# Patient Record
Sex: Female | Born: 1985
Health system: Southern US, Community
[De-identification: ages and names within clinical notes are randomized; demographics above are authoritative.]

## PROBLEM LIST (undated history)

## (undated) DIAGNOSIS — K589 Irritable bowel syndrome without diarrhea: Secondary | ICD-10-CM

## (undated) DIAGNOSIS — M545 Low back pain, unspecified: Secondary | ICD-10-CM

## (undated) HISTORY — DX: Low back pain: M54.5

## (undated) HISTORY — PX: TUBAL LIGATION: SHX77

## (undated) HISTORY — DX: Low back pain, unspecified: M54.50

## (undated) HISTORY — DX: Irritable bowel syndrome, unspecified: K58.9

---
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
## 2005-01-04 ENCOUNTER — Inpatient Hospital Stay (HOSPITAL_COMMUNITY): Admission: AD | Admit: 2005-01-04 | Discharge: 2005-01-04 | Payer: Self-pay | Admitting: Obstetrics and Gynecology

## 2005-01-25 ENCOUNTER — Inpatient Hospital Stay (HOSPITAL_COMMUNITY): Admission: AD | Admit: 2005-01-25 | Discharge: 2005-01-25 | Payer: Self-pay | Admitting: Obstetrics and Gynecology

## 2005-01-31 ENCOUNTER — Inpatient Hospital Stay (HOSPITAL_COMMUNITY): Admission: AD | Admit: 2005-01-31 | Discharge: 2005-02-06 | Payer: Self-pay | Admitting: Obstetrics and Gynecology

## 2005-02-02 ENCOUNTER — Encounter: Payer: Self-pay | Admitting: Pediatrics

## 2005-02-02 ENCOUNTER — Encounter (INDEPENDENT_AMBULATORY_CARE_PROVIDER_SITE_OTHER): Payer: Self-pay | Admitting: *Deleted

## 2005-02-15 ENCOUNTER — Encounter: Admission: RE | Admit: 2005-02-15 | Discharge: 2005-02-15 | Payer: Self-pay | Admitting: Pediatrics

## 2005-03-16 ENCOUNTER — Other Ambulatory Visit: Admission: RE | Admit: 2005-03-16 | Discharge: 2005-03-16 | Payer: Self-pay | Admitting: Obstetrics and Gynecology

## 2005-05-07 ENCOUNTER — Emergency Department (HOSPITAL_COMMUNITY): Admission: EM | Admit: 2005-05-07 | Discharge: 2005-05-07 | Payer: Self-pay | Admitting: Family Medicine

## 2005-06-04 ENCOUNTER — Emergency Department: Payer: Self-pay | Admitting: Emergency Medicine

## 2005-06-10 ENCOUNTER — Emergency Department: Payer: Self-pay | Admitting: Emergency Medicine

## 2005-06-18 ENCOUNTER — Emergency Department: Payer: Self-pay | Admitting: Emergency Medicine

## 2005-06-30 ENCOUNTER — Emergency Department: Payer: Self-pay | Admitting: Emergency Medicine

## 2005-07-01 ENCOUNTER — Ambulatory Visit: Payer: Self-pay | Admitting: Emergency Medicine

## 2005-07-02 ENCOUNTER — Ambulatory Visit: Payer: Self-pay | Admitting: Emergency Medicine

## 2005-10-19 ENCOUNTER — Ambulatory Visit (HOSPITAL_COMMUNITY): Admission: RE | Admit: 2005-10-19 | Discharge: 2005-10-19 | Payer: Self-pay | Admitting: Obstetrics and Gynecology

## 2005-11-30 ENCOUNTER — Inpatient Hospital Stay (HOSPITAL_COMMUNITY): Admission: AD | Admit: 2005-11-30 | Discharge: 2005-12-01 | Payer: Self-pay | Admitting: Obstetrics and Gynecology

## 2005-12-09 ENCOUNTER — Observation Stay: Payer: Self-pay

## 2005-12-14 ENCOUNTER — Emergency Department (HOSPITAL_COMMUNITY): Admission: EM | Admit: 2005-12-14 | Discharge: 2005-12-14 | Payer: Self-pay | Admitting: Family Medicine

## 2006-01-18 ENCOUNTER — Emergency Department: Payer: Self-pay | Admitting: Emergency Medicine

## 2006-01-20 ENCOUNTER — Inpatient Hospital Stay (HOSPITAL_COMMUNITY): Admission: AD | Admit: 2006-01-20 | Discharge: 2006-01-20 | Payer: Self-pay | Admitting: Obstetrics and Gynecology

## 2006-01-21 ENCOUNTER — Inpatient Hospital Stay (HOSPITAL_COMMUNITY): Admission: AD | Admit: 2006-01-21 | Discharge: 2006-01-21 | Payer: Self-pay | Admitting: Obstetrics and Gynecology

## 2006-01-24 ENCOUNTER — Inpatient Hospital Stay (HOSPITAL_COMMUNITY): Admission: AD | Admit: 2006-01-24 | Discharge: 2006-01-24 | Payer: Self-pay | Admitting: Obstetrics and Gynecology

## 2006-01-30 ENCOUNTER — Inpatient Hospital Stay (HOSPITAL_COMMUNITY): Admission: AD | Admit: 2006-01-30 | Discharge: 2006-01-30 | Payer: Self-pay | Admitting: Obstetrics and Gynecology

## 2006-02-07 ENCOUNTER — Inpatient Hospital Stay (HOSPITAL_COMMUNITY): Admission: AD | Admit: 2006-02-07 | Discharge: 2006-02-11 | Payer: Self-pay | Admitting: Obstetrics and Gynecology

## 2006-03-08 ENCOUNTER — Other Ambulatory Visit: Admission: RE | Admit: 2006-03-08 | Discharge: 2006-03-08 | Payer: Self-pay | Admitting: Obstetrics and Gynecology

## 2006-03-30 ENCOUNTER — Emergency Department: Payer: Self-pay | Admitting: Emergency Medicine

## 2006-04-08 ENCOUNTER — Emergency Department: Payer: Self-pay | Admitting: Emergency Medicine

## 2006-06-19 IMAGING — CT CT HEAD WO/W CM
1 of 3 series · 13 of 30 positions shown, 17 images · IV contrast (omnipaque)
Comparison: none

CLINICAL DATA: Status post C-section this morning.  Seizures.
HEAD CT WITHOUT AND WITH CONTRAST:
TECHNIQUE: 5mm collimated images were obtained from the base of the skull through the vertex according to standard protocol before and after administration of intravenous contrast.  Sagittal and coronal 2-D imaging was performed on CT scanner.
Contrast:  80 cc Omnipaque 300.

[Series 5: head_seq 2.5 h40s · axial · 0.43mm/px · z∈[-133,-10]mm · 13 of 59 slices shown, 17 images]
[im 5/59  brain]
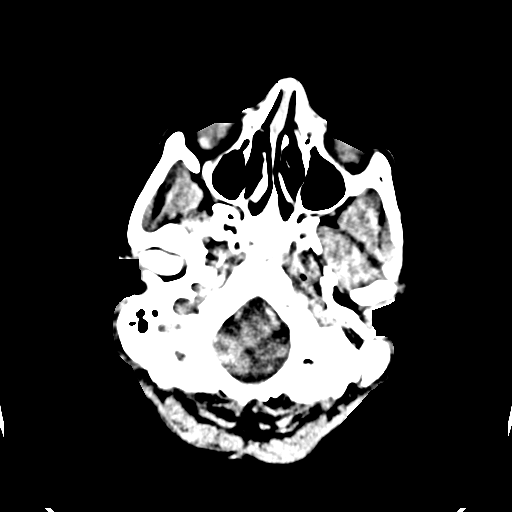
[im 5/59  bone]
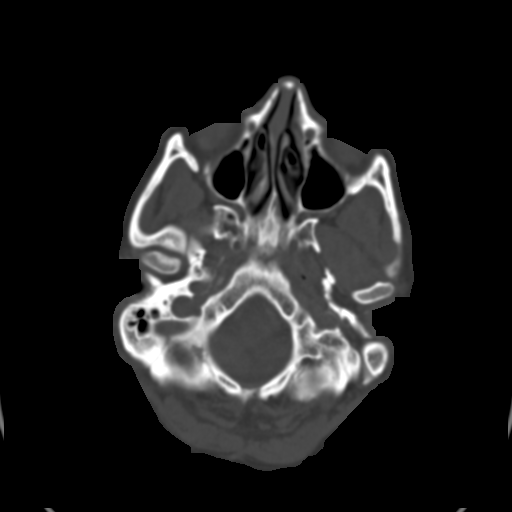
[im 9/59  brain]
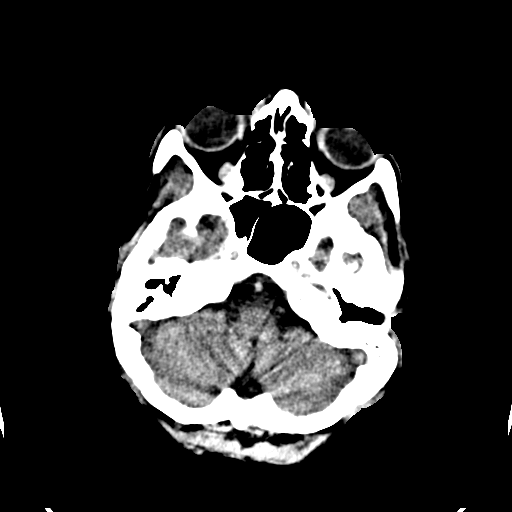
[im 13/59  brain]
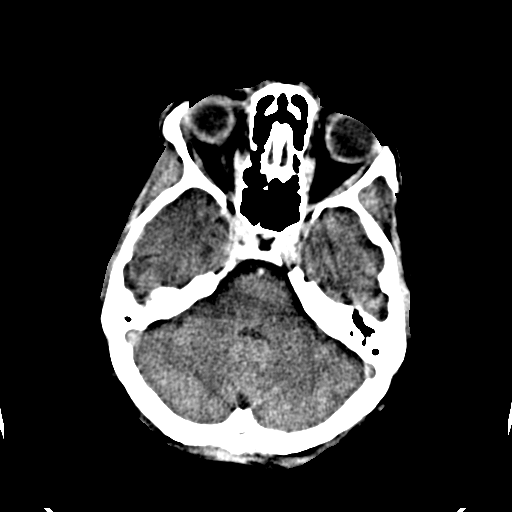
[im 17/59  brain]
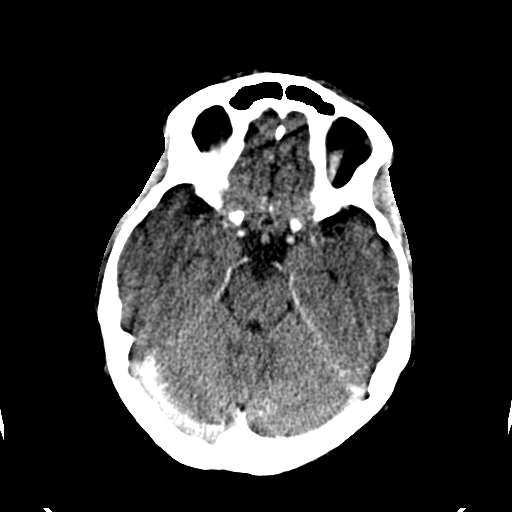
[im 21/59  brain]
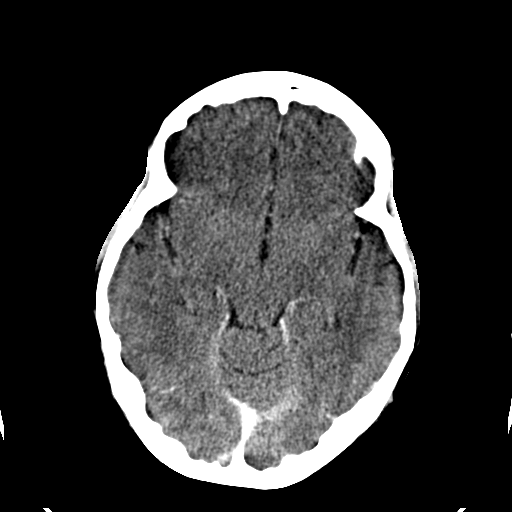
[im 21/59  bone]
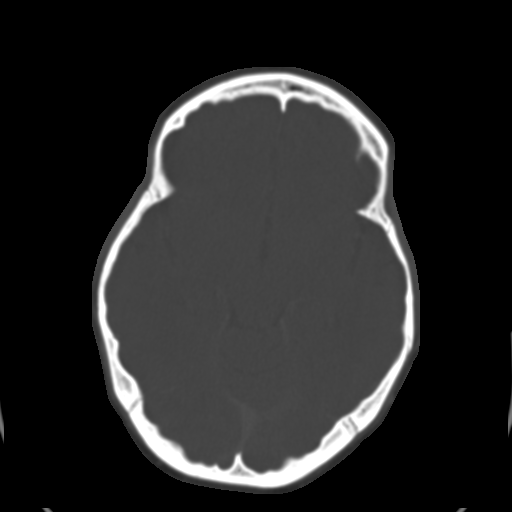
[im 25/59  brain]
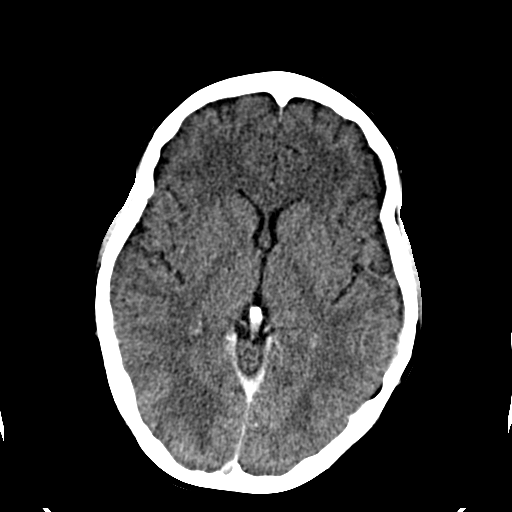
[im 30/59  brain]
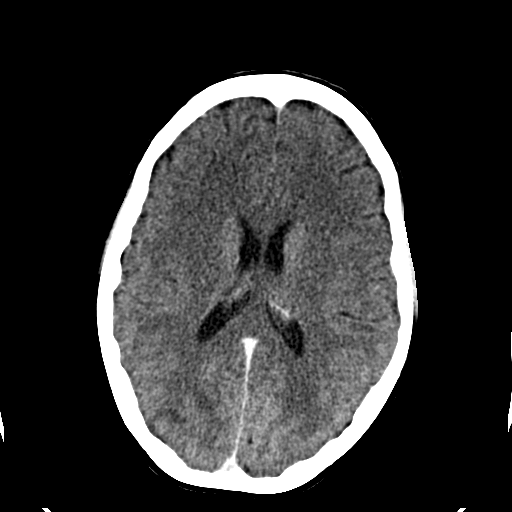
[im 34/59  brain]
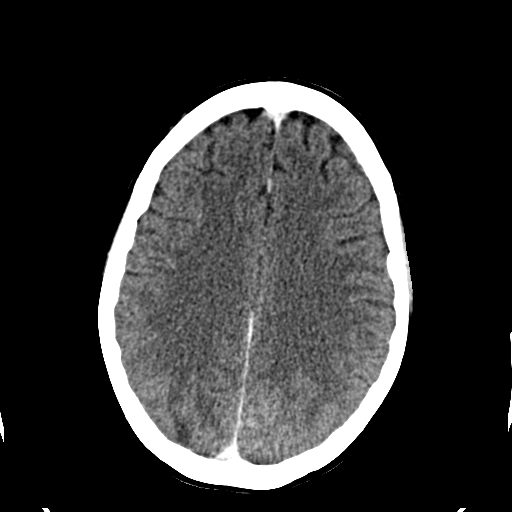
[im 38/59  brain]
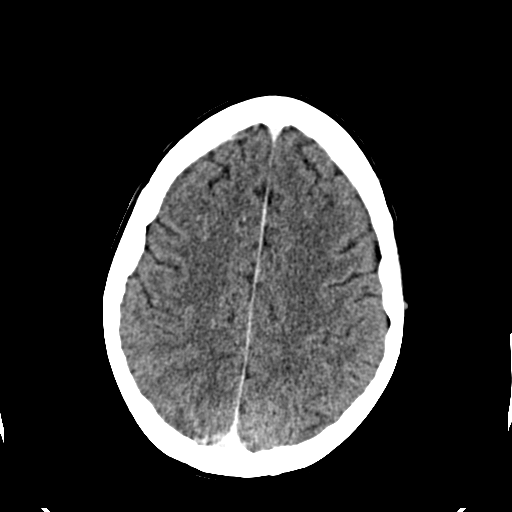
[im 38/59  bone]
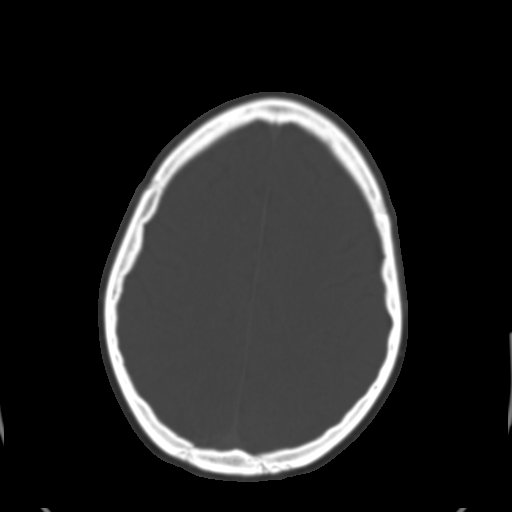
[im 42/59  brain]
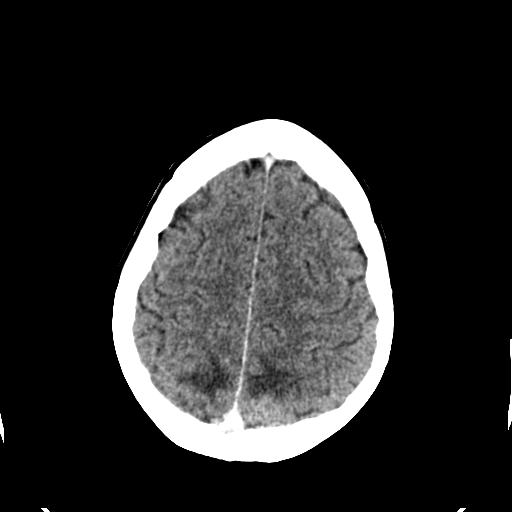
[im 46/59  brain]
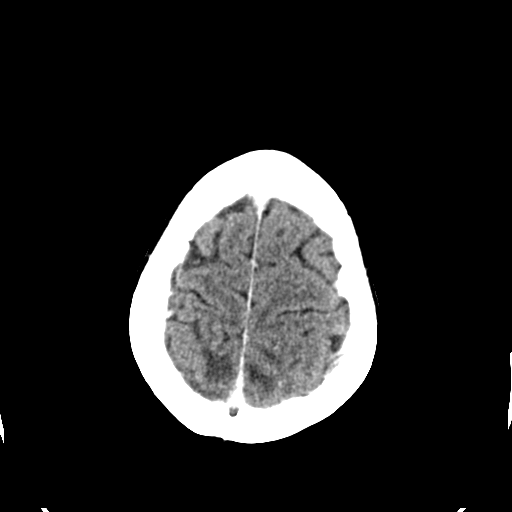
[im 50/59  brain]
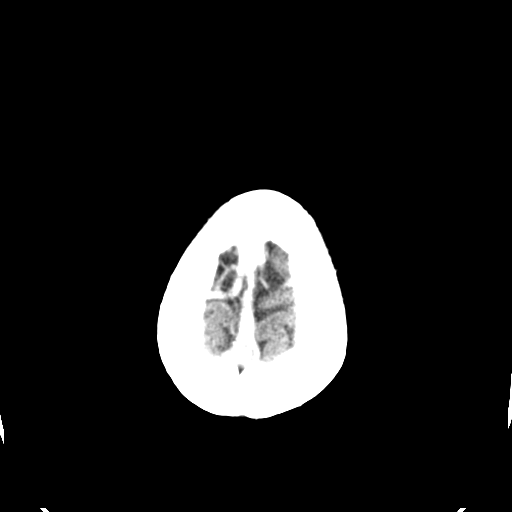
[im 54/59  brain]
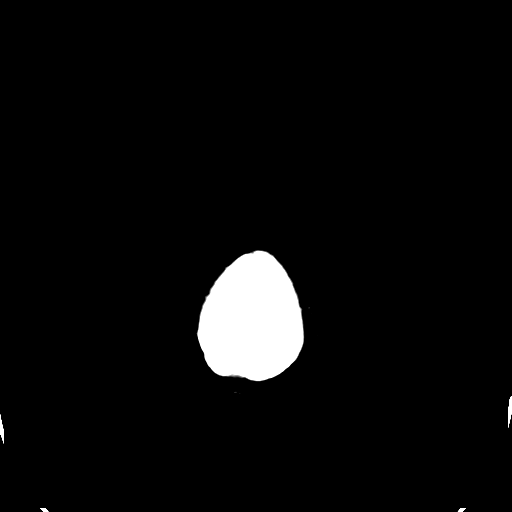
[im 54/59  bone]
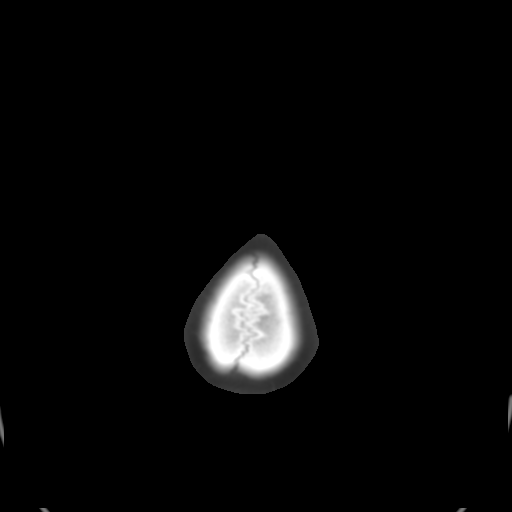

[13 of 30 positions shown; findings below may reference images not displayed]

FINDINGS: Hypodensity is noted within the occipital lobes extending towards the parietal lobes bilaterally in a distribution suggestive of posterior reversible encephalopathy syndrome.  Contrast enhanced exam was performed to exclude superior sagittal sinus thrombosis after discussion with Dr. Jim).  The superior sagittal sinus appears patent on the contrast enhanced imaging.  Transverse sinus and sigmoid sinus are difficult to conclude as completely patent on the present examination secondary to the degree of opacification in this region.  If there are any progressive symptoms, MR imaging after the patient?s skin staples have been removed is recommended.  No hydrocephalus or midline shift.  No intracranial hemorrhage (mild hyperdensity near the occipital abnormality may represent vascular engorgement rather than parenchymal or subarachnoid blood).
Partial opacification of left mastoid air cells.
IMPRESSION: Findings consistent with posterior reversible encephalopathy syndrome (PRES).

## 2006-07-29 ENCOUNTER — Emergency Department: Payer: Self-pay | Admitting: Emergency Medicine

## 2006-08-15 ENCOUNTER — Emergency Department: Payer: Self-pay | Admitting: Emergency Medicine

## 2007-01-21 ENCOUNTER — Ambulatory Visit (HOSPITAL_COMMUNITY): Admission: RE | Admit: 2007-01-21 | Discharge: 2007-01-21 | Payer: Self-pay | Admitting: Obstetrics and Gynecology

## 2007-01-21 ENCOUNTER — Encounter (INDEPENDENT_AMBULATORY_CARE_PROVIDER_SITE_OTHER): Payer: Self-pay | Admitting: Obstetrics and Gynecology

## 2007-01-25 ENCOUNTER — Emergency Department: Payer: Self-pay | Admitting: Emergency Medicine

## 2007-03-31 ENCOUNTER — Other Ambulatory Visit: Admission: RE | Admit: 2007-03-31 | Discharge: 2007-03-31 | Payer: Self-pay | Admitting: Obstetrics and Gynecology

## 2007-07-06 ENCOUNTER — Emergency Department: Payer: Self-pay | Admitting: Emergency Medicine

## 2007-10-29 ENCOUNTER — Emergency Department: Payer: Self-pay | Admitting: Emergency Medicine

## 2008-04-17 ENCOUNTER — Emergency Department: Payer: Self-pay | Admitting: Emergency Medicine

## 2008-06-21 ENCOUNTER — Emergency Department: Payer: Self-pay

## 2008-08-15 ENCOUNTER — Ambulatory Visit: Payer: Self-pay | Admitting: Gastroenterology

## 2009-02-18 ENCOUNTER — Emergency Department: Payer: Self-pay | Admitting: Unknown Physician Specialty

## 2009-08-02 LAB — CONVERTED CEMR LAB: Pap Smear: NORMAL

## 2010-06-11 ENCOUNTER — Encounter: Payer: Self-pay | Admitting: Family Medicine

## 2010-06-19 ENCOUNTER — Ambulatory Visit: Payer: Self-pay | Admitting: Family Medicine

## 2010-06-19 DIAGNOSIS — S335XXA Sprain of ligaments of lumbar spine, initial encounter: Secondary | ICD-10-CM | POA: Insufficient documentation

## 2010-06-19 DIAGNOSIS — E559 Vitamin D deficiency, unspecified: Secondary | ICD-10-CM

## 2010-06-19 DIAGNOSIS — M545 Low back pain: Secondary | ICD-10-CM

## 2010-07-10 ENCOUNTER — Telehealth: Payer: Self-pay | Admitting: Family Medicine

## 2010-08-04 ENCOUNTER — Ambulatory Visit: Payer: Self-pay | Admitting: Family Medicine

## 2010-08-04 ENCOUNTER — Other Ambulatory Visit
Admission: RE | Admit: 2010-08-04 | Discharge: 2010-08-04 | Payer: Self-pay | Source: Home / Self Care | Admitting: Family Medicine

## 2010-08-05 LAB — CONVERTED CEMR LAB: HIV: NONREACTIVE

## 2010-08-06 LAB — CONVERTED CEMR LAB: Pap Smear: NEGATIVE

## 2010-09-19 ENCOUNTER — Telehealth: Payer: Self-pay | Admitting: Family Medicine

## 2010-09-21 ENCOUNTER — Encounter: Payer: Self-pay | Admitting: Pediatrics

## 2010-10-02 NOTE — Progress Notes (Signed)
Summary: X-ray results  Phone Note Call from Patient Call back at Home Phone [phone]   Caller: Patient Call For: Ruthe Mannan MD Summary of Call: Patient wanted to know the results of her lumbar x-ray that should be in her medical records from [facility]. Initial call taken by: Mervin Hack CMA Duncan Dull),  July 10, 2010 8:43 AM  Follow-up for Phone Call        It was normal. Ruthe Mannan MD  July 10, 2010 8:46 AM  Patient advised as instructed via telephone.  Follow-up by: Linde Gillis CMA Duncan Dull),  July 10, 2010 8:54 AM

## 2010-10-02 NOTE — Letter (Signed)
Summary: Records from [facility] 2009 - 2011  Records from [facility] 2009 - 2011   Imported By: Maryln Gottron 07/03/2010 13:57:45  _____________________________________________________________________  External Attachment:    Type:   Image     Comment:   External Document

## 2010-10-02 NOTE — Assessment & Plan Note (Signed)
Summary: cpx/pap/dlo   Vital Signs:  Patient profile:   25 year old female Height:      60 inches Weight:      119.50 pounds BMI:     23.42 Temp:     98.2 degrees F oral Pulse rate:   72 / minute Pulse rhythm:   regular BP sitting:   100 / 60  (right arm) Cuff size:   regular  Vitals Entered By: Linde Gillis CMA Duncan Dull) (August 04, 2010 8:32 AM) CC: complete physical with pap   History of Present Illness: 25 yo here for CPX with no complaints.     Well woman- G2P2, one abnormal pap smear less than 5 years ago.  Last December, pap was normal. Sexually active with husband only. Had fasting labs at previous PMD on 06/11/10.  LDL was 98, HDL 53, TG 80.  BMET and CBC wnl. Vit D was 25, has not yet started supplementation.   Wants to be tested for STDs.  Married and sexually active with husband only but want to be sure.  Allergies (verified): No Known Drug Allergies  Review of Systems      See HPI General:  Denies malaise. Eyes:  Denies blurring. GU:  Denies dysuria, incontinence, and urinary frequency.  Physical Exam  General:  Well-developed,well-nourished,in no acute distress; alert,appropriate and cooperative throughout examination Head:  normocephalic and atraumatic.   Eyes:  vision grossly intact, pupils equal, and pupils round.   Ears:  R ear normal and L ear normal.   Nose:  no external deformity.   Mouth:  good dentition.   Neck:  No deformities, masses, or tenderness noted. Breasts:  No mass, nodules, thickening, tenderness, bulging, retraction, inflamation, nipple discharge or skin changes noted.   Lungs:  Normal respiratory effort, chest expands symmetrically. Lungs are clear to auscultation, no crackles or wheezes. Heart:  Normal rate and regular rhythm. S1 and S2 normal without gallop, murmur, click, rub or other extra sounds. Abdomen:  Bowel sounds positive,abdomen soft and non-tender without masses, organomegaly or hernias noted. Rectal:  no external  abnormalities.   Genitalia:  Pelvic Exam:        External: normal female genitalia without lesions or masses        Vagina: normal without lesions or masses        Cervix: normal without lesions or masses        Adnexa: normal bimanual exam without masses or fullness        Uterus: normal by palpation        Pap smear: performed Msk:  No deformity or scoliosis noted of thoracic or lumbar spine.   Extremities:  no edema Neurologic:  alert & oriented X3 and gait normal.   Skin:  Intact without suspicious lesions or rashes Cervical Nodes:  No lymphadenopathy noted Axillary Nodes:  No palpable lymphadenopathy Inguinal Nodes:  No significant adenopathy Psych:  Cognition and judgment appear intact. Alert and cooperative with normal attention span and concentration. No apparent delusions, illusions, hallucinations   Impression & Recommendations:  Problem # 1:  Preventive Health Care (ICD-V70.0) Reviewed preventive care protocols, scheduled due services, and updated immunizations Discussed nutrition, exercise, diet, and healthy lifestyle.  Pap today. Labs UTD.  Problem # 2:  VITAMIN D DEFICIENCY (ICD-268.9) Assessment: New Vit D3 50,000 units x 6 weeks - 1 tab po weekly x 6 weeks,, then continue VitD 1600 IU daily.  Recheck in 8-12 weeks (268.0)  Problem # 3:  SCREENING EXAMINATION FOR  VENEREAL DISEASE (ICD-V74.5)  Orders: T-HIV Antibody  (Reflex) [phone]) T-RPR (Syphilis) ([id number]) Venipuncture (52841)  Complete Medication List: 1)  Vitamin D3 50000 Unit Caps (Cholecalciferol) .Marland Kitchen.. 1 tab po weekly x 6 weeks  Patient Instructions: 1)  Vit D3 50,000 units x 6 weeks - 1 tab po weekly x 6 weeks , then continue VitD 1600 IU daily.  Recheck in 8-12 weeks (268.0) 2)  Have a wonderful Christmas. Prescriptions: VITAMIN D3 50000 UNIT CAPS (CHOLECALCIFEROL) 1 tab po weekly x 6 weeks  #6 x 0   Entered and Authorized by:   Ruthe Mannan MD   Signed by:   Ruthe Mannan MD on 08/04/2010    Method used:   Electronically to        [facility] (retail)       [address], [address]       [place], [place]  [address]       Ph: [phone]       Fax: [phone]   RxID:   [id number]    Orders Added: 1)  T-HIV Antibody  (Reflex) [[id number]] 2)  T-RPR (Syphilis) [phone] 3)  Venipuncture [22025] 4)  Est. Patient 18-39 years [99395]    Current Allergies (reviewed today): No known allergies   HDL Result Date:  06/11/2010 HDL Result:  53 LDL Result Date:  06/11/2010 LDL Result:  98   Appended Document: cpx/pap/dlo

## 2010-10-02 NOTE — Progress Notes (Signed)
Summary: broken toe   Phone Note Call from Patient Call back at [phone]   Caller: Patient Call For: Ruthe Mannan MD Summary of Call: Patient says that she had broken her toe in the past and she thinks that she has broken the same toe again. She saw a dr. at [facility] for this the first time, but really doesn't want to go back to him. She is asking if you could referr her to some one else. Please advise.  Initial call taken by: Melody Comas,  September 19, 2010 8:36 AM  Follow-up for Phone Call        I can place order for ortho referral and see where else Shirlee Limerick can send her but she would need to provide [facility] with more information about her toe, like which toe it is, etc. Ruthe Mannan MD  September 19, 2010 9:57 AM   Additional Follow-up for Phone Call Additional follow up Details #1::        Called the patient and gave her the number for [facility]. She wanted to call herself. Additional Follow-up by: Carlton Adam,  September 19, 2010 11:48 AM  New Problems: ? of FRACTURE, TOE (ICD-826.0)   New Problems: ? of FRACTURE, TOE (ICD-826.0)

## 2010-10-02 NOTE — Assessment & Plan Note (Signed)
Summary: NEW PT TO EST/CLE   Vital Signs:  Patient profile:   25 year old female Height:      60 inches Weight:      117 pounds BMI:     22.93 Temp:     98.3 degrees F oral Pulse rate:   80 / minute Pulse rhythm:   regular BP sitting:   100 / 60  (left arm) Cuff size:   regular  Vitals Entered By: Linde Gillis CMA Duncan Dull) (June 19, 2010 9:55 AM) CC: new patient, establish care   CC:  new patient and establish care.  History of Present Illness: 25 yo here to establish care.  Low back pain- ongoing issue for several months.  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  Pain is bilateral, lumbar.  No radiculopathy or weakness.  Pain comes and goes, worsened by certain movements like picking something up. Xray done last week at previous MD office, per pt showed small herniated disc and given Flexeril which is not helping.  ?Vitamin D deficiency- was told last week Vit D is very low, not on vit d replacement (awaiting labs).  Well woman- G2P2, one abnormal pap smear less than 5 years ago.  Last December, pap was normal. Sexually active with husband only. Had fasting labs last week.   Preventive Screening-Counseling & Management  Alcohol-Tobacco     Smoking Status: never      Drug Use:  no.    Current Medications (verified): 1)  Skelaxin 800 Mg Tabs (Metaxalone) .Marland Kitchen.. 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 Eyes:  Denies blurring. ENT:  Denies difficulty swallowing. CV:  Denies chest pain or discomfort. Resp:  Denies shortness of breath. GI:  Denies abdominal pain and change in bowel habits. GU:  Denies discharge and dysuria. MS:  Complains of low back pain; denies loss of strength. Derm:  Denies rash. Neuro:  Denies visual disturbances and weakness. Psych:  Denies anxiety and depression. Endo:  Denies cold intolerance and heat intolerance.  Physical Exam  General:  Well-developed,well-nourished,in no acute distress; alert,appropriate and cooperative throughout examination Head:  normocephalic and atraumatic.   Eyes:  vision grossly intact, pupils equal, and pupils round.   Ears:  R ear normal and L ear normal.   Nose:  no external deformity.   Mouth:  good dentition.   Neck:  No deformities, masses, or tenderness noted. Lungs:  Normal respiratory effort, chest expands symmetrically. Lungs are clear to auscultation, no crackles or wheezes. Heart:  Normal rate and regular rhythm. S1 and S2 normal without gallop, murmur, click, rub or other extra sounds. Abdomen:  Bowel sounds positive,abdomen soft and non-tender without masses, organomegaly or hernias noted. Msk:  mod tight lumbar paraspinous muslces, bilaterally, NTTP. normal strength. SLR, Faber neg bilaterally Extremities:  no edema. Neurologic:  alert &  oriented X3, strength normal in all extremities, and DTRs symmetrical and normal.   Skin:  Intact without suspicious lesions or rashes Psych:  Cognition and judgment appear intact. Alert and cooperative with normal attention span and concentration. No apparent delusions, illusions, hallucinations   Impression & Recommendations:  Problem # 1:  BACK STRAIN, LUMBAR (ICD-847.2) Assessment New Awaiting xray from previous MD. reassuring finding on exam, no  radiulopathy. Reflexes symmetrical and normal. Will try Skelaxin, continue exercises as discussed.  Problem # 2:  VITAMIN D DEFICIENCY (ICD-268.9) Assessment: New Awaiting labs, if low will need high dose replacement.  Pt in agreement with plan.  Complete Medication List: 1)  Skelaxin 800 Mg Tabs (Metaxalone) .Marland Kitchen.. 1 tab by mouth three times a day as needed back pain  Patient Instructions: 1)  Great to meet you. 2)  We will call you when we get your records from your previous doctor.  Call us next week if you have not heard from [place] yet. 3)  Make an appointment for a complete physical/Pap after 08/02/2010. Prescriptions: SKELAXIN 800 MG TABS (METAXALONE) 1 tab by mouth three times a day as needed back pain  #30 x 0   Entered and Authorized by:   Ruthe Mannan MD   Signed by:   Ruthe Mannan MD on 06/19/2010   Method used:   Print then Give to Patient   RxID:   [id number]    Orders Added: 1)  New Patient Level III [99203]    Prior Medications (reviewed today): None Current Allergies (reviewed today): No known allergies  Flu Vaccine Result Date:  06/12/2010 Flu Vaccine Result:  historical TD Result Date:  06/09/2006 TD Result:  historical PAP Result Date:  08/02/2009 PAP Result:  normal-historical

## 2010-12-15 ENCOUNTER — Encounter: Payer: Self-pay | Admitting: *Deleted

## 2010-12-15 ENCOUNTER — Encounter: Payer: Self-pay | Admitting: Family Medicine

## 2010-12-15 ENCOUNTER — Ambulatory Visit (INDEPENDENT_AMBULATORY_CARE_PROVIDER_SITE_OTHER): Admitting: Family Medicine

## 2010-12-15 VITALS — BP 110/70 | Temp 101.2°F | Ht 60.0 in | Wt 125.1 lb

## 2010-12-15 DIAGNOSIS — J029 Acute pharyngitis, unspecified: Secondary | ICD-10-CM

## 2010-12-15 MED ORDER — AMOXICILLIN 500 MG PO CAPS: 1000.0000 mg | ORAL_CAPSULE | Freq: Three times a day (TID) | ORAL | Status: AC

## 2010-12-15 NOTE — Patient Instructions (Signed)
Strep Throat, Adult Strep throat is an infection of the throat caused by a germ (bacteria). A bacteria is a type of tiny living thing that may cause disease. It is most common in late winter and early spring but can happen any time of the year. For patients who have not had their tonsils removed before, this germ can cause Strep tonsillitis. If someone has had the tonsils removed, they can still get Strep throat. Strep throat is contagious and can spread through coughing or sneezing and other close contact with someone who has this problem. SYMPTOMS Common symptoms may include:  Fever.   Painful, red tonsils and/or throat.   White or yellow spots on tonsils and/or throat.   Swollen, tender lymph nodes or "glands" of the neck and/or under the jaw.   Red rash all over the body (uncommon).  DIAGNOSIS In most cases, a "rapid strep test" can help your caregiver make the diagnosis in a few minutes. If this test is not available, a light swab of infected area can be used for a throat culture to see if Strep bacteria are present. The results of a throat culture take about 2 days. TREATMENT Strep throat is generally treated with antibiotic medicine. HOME CARE INSTRUCTIONS  Gargle with 1 teaspoon of salt in 1 cup of warm water, 3 to 4 times per day or as necessary for comfort.   Family members with a sore throat or fever should have a medical examination or throat culture. If there has been a positive throat culture in the family, your caregiver may treat the rest of the family without seeing them. This depends upon his knowledge of their condition and his familiarity with you and your family.   You may return to work when you feel able.   Only take over-the-counter or prescription medicines for pain, discomfort or fever as directed by your caregiver.  USE IBUPROFEN 800 MG THREE TIMES A DAY FOR THROAT PAIN AND FEVER. SEEK MEDICAL CARE IF:  You have an oral temperature above 100 on antibiotics.     You develop large glands in your neck.   You develop a rash, cough or earache.   You cough up green, yellow-brown or bloody sputum.   You have pain or discomfort not controlled by medications or if problems seem to be getting worse rather than better.  SEEK IMMEDIATE MEDICAL CARE IF:  You develop any new symptoms such as vomiting, severe headache, stiff or painful neck, chest pain, shortness of breath, trouble breathing or swallowing.   You develop severe throat pain, drooling or changes in voice.   You develop swelling of the neck, or the skin on the neck becomes red and tender.   You have an oral temperature above 103, not controlled by medicine.  Document Released: 08/14/2000 Document Re-Released: 11/11/2009 [facility] Patient Information 2011 [place], [place].

## 2010-12-15 NOTE — Progress Notes (Signed)
Subjective:    Patient ID: [name], female    DOB: Mar 22, 1986, 25 y.o.   MRN: [id number]  Sore Throat  This is a new problem. The current episode started in the past 7 days. The problem has been gradually worsening. Neither side of throat is experiencing more pain than the other. The maximum temperature recorded prior to her arrival was 101 - 101.9 F. The fever has been present for 1 to 2 days. The pain is at a severity of 5/10. The pain is moderate. Associated symptoms include headaches and trouble swallowing. Pertinent negatives include no abdominal pain, congestion, drooling, ear discharge, ear pain, hoarse voice, shortness of breath or stridor. She has had no exposure to strep or mono. Exposure to: 25 year old with recent viral infection. She has tried cool liquids and gargles for the symptoms. The treatment provided mild relief.      Review of Systems  Constitutional: Positive for fever and fatigue.  HENT: Positive for trouble swallowing. Negative for ear pain, nosebleeds, congestion, hoarse voice, drooling, postnasal drip and ear discharge.   Eyes: Negative for pain.  Respiratory: Negative for shortness of breath and stridor.   Cardiovascular: Negative for chest pain.  Gastrointestinal: Negative for abdominal pain.  Genitourinary: Negative for dysuria.  Skin: Negative for rash.  Neurological: Positive for headaches. Negative for weakness and numbness.       Objective:   Physical Exam  Constitutional: Vital signs are normal. She appears well-developed and well-nourished. She is cooperative.  Non-toxic appearance. She does not appear ill. No distress.  HENT:  Head: Normocephalic.  Right Ear: Hearing, tympanic membrane, external ear and ear canal normal. Tympanic membrane is not erythematous, not retracted and not bulging.  Left Ear: Hearing, tympanic membrane, external ear and ear canal normal. Tympanic membrane is not erythematous, not retracted and not bulging.  Nose:  Mucosal edema present. No rhinorrhea. Right sinus exhibits no maxillary sinus tenderness and no frontal sinus tenderness. Left sinus exhibits no maxillary sinus tenderness and no frontal sinus tenderness.  Mouth/Throat: Uvula is midline and mucous membranes are normal. Oropharyngeal exudate and posterior oropharyngeal erythema present. No posterior oropharyngeal edema or tonsillar abscesses.  Eyes: Conjunctivae, EOM and lids are normal. Pupils are equal, round, and reactive to light. No foreign bodies found.  Neck: Trachea normal and normal range of motion. Neck supple. Carotid bruit is not present. No mass and no thyromegaly present.  Cardiovascular: Normal rate, regular rhythm, S1 normal, S2 normal, normal heart sounds, intact distal pulses and normal pulses.  Exam reveals no gallop and no friction rub.   No murmur heard. Pulmonary/Chest: Effort normal and breath sounds normal. Not tachypneic. No respiratory distress. She has no decreased breath sounds. She has no wheezes. She has no rhonchi. She has no rales.  Lymphadenopathy:    She has cervical adenopathy.       Right cervical: Superficial cervical adenopathy present.       Left cervical: Superficial cervical adenopathy present.  Neurological: She is alert.  Skin: Skin is warm, dry and intact. No rash noted.  Psychiatric: Her speech is normal and behavior is normal. Judgment normal. Her mood appears not anxious. Cognition and memory are normal. She does not exhibit a depressed mood.          Assessment & Plan:  Acute Pharyngitis:  Despite negative rapid strep... symptoms are typical. Will treat with amoxicillin x 10 days. Ibuprofen for pain and fever. Call if not improving as expected or fever on  antibiotics.

## 2010-12-17 ENCOUNTER — Telehealth: Payer: Self-pay | Admitting: *Deleted

## 2010-12-17 NOTE — Telephone Encounter (Signed)
Patient stated that she saw Dr. Ermalene Searing on Monday and was diagnosed with strep throat.  She stated that she has been taking Amoxicillin and Motrin and her throat is still swollen.  She has not been running a fever but in a lot of pain.  She was crying during the call.  Her PCP is Dr. Dayton Martes who is at the nursing home this afternoon without computer access.  Please advise.

## 2010-12-17 NOTE — Telephone Encounter (Signed)
Patient advised.

## 2010-12-17 NOTE — Telephone Encounter (Signed)
Will not be better yet - too early  Can use chloraseptic spray for throat as much as she wants. Cannot overdose on it.  cepacol lozenges have a numbing effect also  May want to try both tylenol and motrin  Cont abx

## 2011-01-16 NOTE — Op Note (Signed)
NAME:  [name], [name]              ACCOUNT NO.:  [id number]   MEDICAL RECORD NO.:  [id number]          PATIENT TYPE:  INP   LOCATION:  9148                          [facility]ILITY:  [facility]   PHYSICIAN:  Charles A. Delcambre, MDDATE OF BIRTH:  [birth year]   DATE OF PROCEDURE:  02/08/2006  DATE OF DISCHARGE:                                 OPERATIVE REPORT   PREOPERATIVE DIAGNOSES:  1.  Intrauterine pregnancy at 36 weeks and 6 days.  2.  Preterm labor.  3.  Previous cesarean section.   POSTOPERATIVE DIAGNOSES:  1.  Intrauterine pregnancy at 36 weeks and 6 days.  2.  Preterm labor.  3.  Previous cesarean section.   OPERATION PERFORMED:  Repeat low transverse cesarean section.   SURGEON:  Charles A. Delcambre, MD   ASSISTANT:  None.   ESTIMATED BLOOD LOSS:  500 mL.   ANESTHESIA:  Spinal.   COMPLICATIONS:  None.   SPECIMENS:  __________ , L&D.  Cord arterial blood gases 7.33.   FINDINGS:  Vigorous female, Apgars 8 and 9.   DESCRIPTION OF PROCEDURE:  The patient was taken to the operating room,  placed in supine position. Anesthesia was induced without difficulty.  Anesthesia was adequate.  Sterile prep and drape was undertaken without  difficulty in the usual fashion.  A Pfannenstiel incision was made with  knife and carried down to fascia. The fascia was incised with a knife and  Mayo scissors.  Rectus sheath was sharply released superiorly and  inferiorly.  The rectus muscles were separated sharply in the midline.  The  peritoneum was entered with the Metzenbaum scissors and opened superiorly  and inferiorly and a bladder blade was placed.  Vesicouterine peritoneum was  incised with Metzenbaum scissors and blunt dissection was used to develop  the bladder flap and bladder blade was replaced.  Lower uterine segment  transverse incision was made  to amniotomy without injury to the infant.  Traction was used to extend the incision. Hand was inserted and occiput was  lifted into the  incision site.  Fundal pressure was placed by the operator's  assistant.  The infant was delivered without difficulty.  Cord was doubly  clamped.  The infant was shown to parents and taken to Dr. Eric Form  neonatologist in attendance.  Cord gas was taken.  Placenta was manually  expressed.  Uterus was externalized for repair.  Internal surface of the  uterus was wiped with a moistened lap.  #1 chromic running and locking first  layer then imbricating nonlocking second layer were used to close the  uterine incision.  Two figure-of-eight sutures were used in the midline to  achieve hemostasis.  When hemostasis was adequate, the uterus was  reinternalized when I felt hemostasis was excellent. Pericolic gutter and  bladder flap area were irrigated.  Hemostasis was verified on the uterine  incision site and the bladder flap.  Hemostasis was excellent.  Attention  was turned to the subfascial area.  Hemostasis was excellent.  Fascia was  then closed with #1 Vicryl running nonlocking suture.  Subcutaneous  hemostasis was excellent after irrigation.  Minor electrocautery was used  and hemostasis was verified.  Sterile skin clips were used to close the  skin.  Sterile dressing was applied.  The patient tolerated the procedure  well, was taken to recovery with physician in attendance.      Charles A. Sydnee Cabal, MD  Electronically Signed     CAD/MEDQ  D:  02/08/2006  T:  02/08/2006  Job:  [id number]

## 2011-01-16 NOTE — Discharge Summary (Signed)
NAME:  [name], [name]                ACCOUNT NO.:  [id number]   MEDICAL RECORD NO.:  [id number]         PATIENT TYPE:  INP   LOCATION:                                [facility]   PHYSICIAN:  James A. Ashley Royalty, M.D.DATE OF BIRTH:  Dec 22, 1985   DATE OF ADMISSION:  01/31/2005  DATE OF DISCHARGE:  02/06/2005                                 DISCHARGE SUMMARY   ADMISSION DIAGNOSES:  1.  Pregnancy at term.  2.  Chest pain.  3.  Failure to progress.   DISCHARGE DIAGNOSES:  1.  Pregnancy at term.  2.  Eclampsia.  3.  Failure to progress.   OPERATION/PROCEDURE:  Low cervical transverse cesarean section.   HISTORY OF PRESENT ILLNESS:  The patient is an 25 year old prima gravida  with a LMP of 05/01/2004 with an EDC of 02/05/2005.  Prenatal course was  uncomplicated.  Blood type A positive, antibody screen negative.  RPR, HPV,  HIV nonreactive.  GBS was negative.   HOSPITAL COURSE AND TREATMENT:  The patient was admitted at [redacted] weeks  gestation, initially with difficulty breathing and chest pain.  On  admission, lungs were clear.  Oxygen saturation 98%, blood pressure 142/84,  pulse 79, respirations 20, temperature 97.4.  The patient indicated that the  area of difficulty was in her left upper chest which seemed to cause her  difficulty when she took a deep breath.  This complaint quickly resolved  after her presentation and she was without followup complaints of chest  pain.  Cervix was fingertip, 50%, vertex presentation.  She was admitted for  labor management and progressed to complete dilatation.  After that point,  failed to progress.  Developed elevation of blood pressure and was taken to  the OR for cesarean section which was performed by Dr. Audie Box under  general anesthesia.  Initially epidural anesthesia was attempted.  The  patient did have a grand mal seizure and, therefore, general anesthesia was  instituted to deliver the infant.  She was delivered of an Apgars 8 and  9,  female infant, weighing 7 pounds 4 ounces with a cord pH of 7.3.   Postpartum she was managed with magnesium sulfate.  Neurological  consultation was obtained with Dr. Ellison Carwin who recommended an MRI  and EEG and slow tapering of the magnesium sulfate according to protocol.  Condition continued the improve and steroid-eluting was able to be  discharged in satisfactory condition on 02/06/2005.   DISCHARGE MEDICATIONS:  1.  Labetalol 200 mg b.i.d.  2.  Chromagen.  3.  Motrin 600 mg.  4.  Percocet.   FOLLOW UP:  She was to be followed up in the office in 10 days.   Postoperative CBC showed hematocrit 22.3, hemoglobin 7.3, platelets 370.      Elwyn Lade   MKH/MEDQ  D:  03/11/2005  T:  03/11/2005  Job:  [id number]

## 2011-01-16 NOTE — H&P (Signed)
NAME:  [name], [name]                ACCOUNT NO.:  [id number]   MEDICAL RECORD NO.:  [id number]          PATIENT TYPE:  INP   LOCATION:  9169                          [facility]   PHYSICIAN:  Timothy P. Fontaine, M.D.DATE OF BIRTH:  1986-03-27   DATE OF ADMISSION:  01/31/2005  DATE OF DISCHARGE:                                HISTORY & PHYSICAL   CHIEF COMPLAINT:  Pregnancy at term, chest pains.   HISTORY OF PRESENT ILLNESS:  25 year old G1, P0 female at [redacted] weeks gestation  who called the triage nurse complaining of difficulty breathing and chest  pain and was instructed to proceed to [facility].  On initial  evaluation the patient was found to be resting comfortably with blood  pressure 142/84, pulse 79, respirations 20, temperature 97.4.  Her oxygen  saturations were 98%.  Her external fetal monitor showed contractions every  three minutes with a reactive fetal tracing.  On initial examination her  lungs were clear and the area to which the patient was pointing was in the  upper left chest that seemed to cause her difficulty when she took a deep  breath.  This complaint quickly resolved after presentation and she was  without further complaints of chest pain.  She was noted to have bilateral  pedal edema and she is admitted at this time with diagnosis of term  pregnancy, prodromal labor, elevated blood pressure and swelling.  Her  prenatal course has been uncomplicated with a reported beta Strep negative.  For the remainder of her history see her [place].   PHYSICAL EXAMINATION:  VITAL SIGNS:  On current examination her blood  pressure is 142/78, repeat 151/91.  Other vital signs stable.  HEENT:  Normal.  LUNGS:  Clear bilaterally.  CARDIAC:  Without rubs, murmurs or gallops.  ABDOMEN:  Gravid vertex fetus, appropriate for term.  External monitors show  reactive fetal tracing with irregular contractions.  PELVIC:  Cervix is fingertip, 50% high, vertex presentation.  EXTREMITIES:  With 2+ bilateral pedal edema.  Reflexes 2+ symmetrical.   LABORATORY DATA:  PIH labs are normal. Platelet count 316,000. Hemoglobin is  noted to be 8.9.  Liver function tests are normal.  LDH 136.  Uric acid 4.5.   ASSESSMENT/PLAN:  25 year old G1, P0, 39 weeks prodromal labor versus false  labor, elevated blood pressure, pedal edema.  PIH labs negative.  Cervix  unfavorable.  Discussed situation with the patient given the elevated blood  pressures and the pedal edema at 25 weeks feel most prudent to move towards  delivery.  Will plan on Cervidil initially with subsequent Pitocin  induction.  Will monitor blood pressures.  Beta Strep noted to be negative.  Patient understands and agrees with the plan.     TPF/MEDQ  D:  02/01/2005  T:  02/01/2005  Job:  [id number]

## 2011-01-16 NOTE — Discharge Summary (Signed)
NAME:  [name], [name]              ACCOUNT NO.:  [id number]   MEDICAL RECORD NO.:  [id number]          PATIENT TYPE:  INP   LOCATION:  9148                          [facility]   PHYSICIAN:  James A. Ashley Royalty, M.D.DATE OF BIRTH:  Dec 27, 1985   DATE OF ADMISSION:  02/07/2006  DATE OF DISCHARGE:  02/11/2006                                 DISCHARGE SUMMARY   DISCHARGE DIAGNOSES:  1.  Intrauterine pregnancy at 36 weeks and 5 days gestation, delivered.  2.  Previous cesarean section.  3.  Labor.   OPERATIONS AND SPECIAL PROCEDURES:  Repeat low transverse cesarean section.   CONSULTATIONS:  None.   DISCHARGE MEDICATIONS:  Percocet, Motrin.   HISTORY AND PHYSICAL:  This is a 25 year old para 1-0-0-1 at 36 weeks and 5  days gestation.  The patient presented complaining of contractions.  She had  a previous low transverse cesarean section.  Initial cervical examination  revealed 1-2 cm dilatation, 75% effacement, -2 station.  The patient was  admitted for repeat low transverse cesarean section.   HOSPITAL COURSE:  The patient was admitted and taken to the operating room.  On February 08, 2006, repeat cesarean section was performed by Dr. Sydnee Cabal.  The infant was noted to be a 5 pound 12 ounce female, Apgars 8 at 1 minute, 9  at 5 minutes, sent to the newborn nursery.  The patient's postoperative  course was benign.  She was discharged on 14 June afebrile and in  satisfactory condition.   DISPOSITION:  The patient is to return to [facility]  in approximately 6 weeks for postoperative evaluation.      James A. Ashley Royalty, M.D.  Electronically Signed     JAM/MEDQ  D:  03/24/2006  T:  03/24/2006  Job:  [id number]

## 2011-01-16 NOTE — Op Note (Signed)
NAME:  [name], [name]                ACCOUNT NO.:  [id number]   MEDICAL RECORD NO.:  [id number]          PATIENT TYPE:  INP   LOCATION:  9371                          [facility]   PHYSICIAN:  Timothy P. Fontaine, M.D.DATE OF BIRTH:  Aug 22, 1986   DATE OF PROCEDURE:  02/02/2005  DATE OF DISCHARGE:                                 OPERATIVE REPORT   PREOPERATIVE DIAGNOSES:  1.  Failure to progress.  2.  Maternal pain.  3.  Elevated blood pressure.  4.  In labor.   POSTOPERATIVE DIAGNOSES:  1.  Failure to progress.  2.  Maternal pain.  3.  Elevated blood pressures labor.  4.  Grand mal seizure.   PROCEDURE:  Primary low transverse cervical cesarean section.   ANESTHETIC:  Attempted spinal, subsequent general anesthetic.   SURGEON:  Timothy P. Fontaine, M.D.   ASSISTANT:  Scrub technician.   ESTIMATED BLOOD LOSS:  Less than 500 mL.   COMPLICATIONS:  Grand mal seizure.   SPECIMEN:  Samples of cord blood, cord pH, placenta.   FINDINGS:  At 3:58 a normal female infant, Apgars 8 and 9, weight 7 pounds 4  ounces, cord pH 7.3.  Pelvic anatomy noted to be normal.   PROCEDURE:  The patient was taken to the operating room.  Initial attempt at  spinal anesthesia in the lateral supine position was unsuccessful.  The  patient was then placed in the sitting position and initial attempts at  spinal placement were made.  At this point the patient underwent an apparent  grand mal seizure, at which time she was placed in supine position and  underwent general anesthesia.  The abdomen was prepped simultaneously with  Betadine solution, draped in the usual fashion and after okay by anesthetic  personnel, the abdomen was sharply entered through a Pfannenstiel, incision  achieving adequate hemostasis at all levels.  The bladder flap was sharply  and bluntly developed without difficulty.  The uterus was sharply entered in  the lower uterine segment and bluntly extended laterally.  The bulging  membranes were ruptured and meconium fluid was noted.  The infant's head was  then delivered through the incision and the infant underwent suction with  the DeLee catheter and subsequent with the bulb suction, and the rest of the  infant was delivered, the cord doubly clamped and cut, and the infant was  handed to pediatrics in attendance.  Samples of cord blood as well as a cord  pH were obtained.  Placenta was then spontaneously extruded, noted to be intact, and was sent  to pathology.  The uterus was exteriorized and the endometrial cavity was  explored with the sponge to remove all placental and membrane fragments.  The patient received 1 g cefazolin antibiotic prophylaxis at this time.  The  uterine incision was then closed in two layers using 0 Vicryl suture first  in a running interlocking stitch, followed by an imbricating stitch.  The  uterus was then returned to the abdomen, which was copiously irrigated.  Adequate hemostasis was visualized, and the anterior fascia was  reapproximated using 0  Vicryl suture in a running stitch.  The subcutaneous  tissues were irrigated.  Adequate hemostasis was achieved with  electrocautery.  The skin was reapproximated with staples.  A sterile  dressing was applied, and the patient was awakened without difficulty and  taken to the recovery room in good condition, having tolerated the procedure  well.       TPF/MEDQ  D:  02/02/2005  T:  02/02/2005  Job:  [id number]

## 2011-01-16 NOTE — Consult Note (Signed)
NAME:  [name], [name]                ACCOUNT NO.:  [id number]   MEDICAL RECORD NO.:  [id number]          PATIENT TYPE:  INP   LOCATION:  9371                          [facility]   PHYSICIAN:  Deanna Artis. Hickling, M.D.DATE OF BIRTH:  [birth year]   DATE OF CONSULTATION:  02/02/2005  DATE OF DISCHARGE:                                   CONSULTATION   CHIEF COMPLAINT:  New onset of seizures.   HISTORY OF PRESENT CONDITION:  [name] is an 25 year old, right-handed,  single mother of a newborn (this morning).  [name] had a full term pregnancy  and required delivery by cesarean section for cephalopelvic disproportion  and failure to progress.  The child is a 7 pound 4 ounce term infant, pH  7.3, with Apgars of 8 and 9.   As [name] was being prepped for an epidural while sitting up, she suddenly  had a 2-minute generalized tonoclonic seizure.  She was placed under general  anesthesia and the baby was delivered.  At the time that she had a seizure,  the patient had an elevated blood pressure with blood pressures in the 140-  160/80-90 range.  Laboratories for pregnancy induced hypertension were  unremarkable.  She was placed on magnesium sulfate and given morphine in the  aftermath for pain.  The patient has evidence of microcytic anemia with a  hemoglobin of 9.2, MCV of 74.6.  White count bumped up to 16,000 as a result  of demargination.  Absolute neutrophil count was 14,300.  This is a result  of the seizure.  The patient has a hypokalemia of 3.0, a normal sodium of  141, glucose slightly elevated at 145, also a result of her seizure.  Liver  function is normal other than a slightly elevated alkaline phosphatase,  which is nonsignificant.  Total protein is low at 5.4, albumin low at 2.3,  calcium low at 8.1, but this reflects the low albumin.  LDH is slightly  elevated at 280 and this is nonspecific.  Renal functions were normal.  BUN  1, creatinine 0.5, magnesium elevated at 4.9.  She is  receiving supplemental  magnesium.   PAST MEDICAL HISTORY:  The patient has not had serious illnesses, injuries,  or hospitalizations.   PAST SURGICAL HISTORY:  None until today.   OBSTETRICAL HISTORY:  The patient is prima gravida and had no problems  during the pregnancy until her blood pressure increased the night of  delivery.   FAMILY HISTORY:  Negative for seizures, mental retardation, blindness, or  deafness.  There is a history of club feet in three maternal first cousins.  The patient's brother had learning disabilities and dropped out of school.   SOCIAL HISTORY:  The patient is a high Garment/textile technologist.  She lives with her  parents.  The father of the baby is involved with his child and has visited.  The mother does not use tobacco, alcohol, or recreational drugs.   REVIEW OF SYSTEMS:  Remarkable only for headaches and hyperemesis.  The  pregnancy was otherwise unremarkable.  Screens for sexually transmitted  diseases were negative  including Gonorrhea, Chlamydia, group B  Streptococcus, hepatitis B, and HIV.  She is rubella immune.  VDRL negative.  She is A positive, antibody negative.   PHYSICAL EXAMINATION:  GENERAL:  This is a tired mildly lethargic right-  handed young woman 25 years old in no distress.  VITAL SIGNS:  Blood pressure 144/86, resting pulse is 76, respirations 18,  oxygen saturation 99%.  She is afebrile with a temperature of 97.9.  HEENT:  Ears, nose, and throat no signs of infection.  There is no sign of  injury to her tongue or buccal mucosa.  NECK:  Supple.  No bruits.  LUNGS:  Clear.  HEART:  No murmurs.  Pulses are normal.  ABDOMEN:  Soft.  Bowel sounds normal.  No hepatosplenomegaly.  Her abdomen  is tender to palpation.  EXTREMITIES:  No edema or cyanosis.  NEUROLOGIC:  The patient is awake and alert.  She follows commands.  She has  mild lethargy, normal language, memory, and concentration.  Cranial nerves  round reactive pupils.  Visual  fields full to double simultaneous stimuli.  Fundi normal.  Symmetric facial strength.  Midline tongue and uvula.  Air  conduction greater than bone conduction.  Motor examination:  Normal  strength, tone, and mass with good fine motor movements.  No pronator drift.  I did not test her hip flexors.  Sensation intact to cold, vibration,  stereognosis, and proprioception.  Cerebellar examination good finger-to-  nose without repetitive movements.  Gait was not tested.  Reflexes are  brisk.  The patient had bilateral flexor plantar responses.   IMPRESSION:  New onset of seizures recurrent, not definitely epilepsy.  I,  however, doubt the diagnosis of eclampsia.  The patient has a nonfocal  neurologic examination.  There is no family history and no precipitating  factors that I can see, though she has mild iron deficiency anemia and  hypoalbuminemia, as well as hypokalemia, these would not be sufficient  reasons for her seizures.   PLAN:  1.  I would recommend an MRI scan with and without contrast.  2.  EEG.  3.  Hold off any epileptic drugs for now.  4.  Slowly taper magnesium sulfate according to your protocols.  5.  Continue with morphine sulfate for pain control.  6.  The patient is not to drive an automobile for at least a month and this      will depend upon whether she has further seizures and what the workup      shows.   At present I see no reason to perform a lumbar puncture.  The only other  test that I would consider would be urine toxicology screen, which at this  point will be positive for benzodiazepines and opiates, but principally I  would be looking for cocaine, which I think is unlikely based on my  assessment of this patient.  If you have question about this or I can give  assistance, do not hesitate to contact me.       [facility]/MEDQ  D:  02/02/2005  T:  02/02/2005  Job:  [id number]   cc:   Fayrene Fearing A. Ashley Royalty, M.D. [address]  [place], [place] [address]   Fax: [phone]

## 2011-01-16 NOTE — Op Note (Signed)
NAME:  [name], [name]              ACCOUNT NO.:  [id number]   MEDICAL RECORD NO.:  [id number]          PATIENT TYPE:  AMB   LOCATION:  SDC                           FACILITY:  [facility]   PHYSICIAN:  James A. Ashley Royalty, M.D.DATE OF BIRTH:  14-Feb-1986   DATE OF PROCEDURE:  01/21/2007  DATE OF DISCHARGE:                               OPERATIVE REPORT   PREOPERATIVE DIAGNOSES:  1. Desire for removal of Mirena intrauterine device (string not      visualized).  2. Desire for tubal sterilization procedure.   POSTOPERATIVE DIAGNOSES:  1. Desire for removal of Mirena intrauterine device (string not      visualized).  2. Desire for tubal sterilization procedure.   PROCEDURE:  1. Laparoscopic bilateral tubal sterilization procedure (Falope      rings).  2. Removal of brain intrauterine device.   SURGEON:  Rudy Jew. Ashley Royalty, M.D.   ANESTHESIA:  General.   ESTIMATED BLOOD LOSS:  Less than 25 mL.   SPECIMENS:  Mirena IUD.   COMPLICATIONS:  None.   PACKS AND DRAINS:  None.   PROCEDURE:  The patient was taken to the operating room and placed in  the dorsal supine position.  After general anesthetic was administered,  she was placed in the lithotomy position and prepped and draped in the  usual manner for abdominal and vaginal surgery.  Posterior weighted  retractor was placed per vagina.  The anterior lip of the cervix was  grasped with a single-tooth tenaculum.  Jarcho uterine manipulator was  placed per cervix and held in place with the tenaculum.  The bladder was  drained with a red rubber catheter.   After injection of a small quantity of 0.5% Marcaine with 1:200,000  concentration of epinephrine, a 1.2-cm infraumbilical incision was made  in the transverse plane.  A Veress needle was inserted into the  abdominal cavity.  Its location was verified by instillation of saline  and hanging drop technique.  Approximately 3 liters of CO2 were then  instilled into the abdominal cavity to  create a pneumoperitoneum.   Next, a size 10/11 disposable laparoscopic trocar was placed and entered  into the abdominal cavity.  Its location was verified by placement of  the laparoscope.  There was no evidence of any trauma.  Pneumoperitoneum  was maintained with CO2 throughout the procedure.  Next, a 8-mm  laparoscopic trocar was placed in the suprapubic region in the midline.  Transillumination and direct visualization techniques were employed.  The pelvis was thoroughly inspected.  The uterus was normal size, shape  and contour without evidence of any fibroids or endometriosis.  The  ovaries were normal size, shape and contour bilaterally without evidence  of any cyst or endometriosis.  The fallopian tubes were normal size,  shape, contour and length bilaterally with luxuriant fimbria.  The  remainder of the peritoneal surfaces were smooth and glistening.  There  was no evidence of any endometriosis.   The right fallopian tube was grasped and traced to its fimbriated end.  An avascular area in the distal isthmic to proximal ampullary portion  was chosen  for Falope ring application.  A Falope ring was applied  without difficulty.  An excellent knuckle of tube was noted to be  contained within the ring.  Excellent blanching of tissue was noted.  The left fallopian tube was then grasped and traced to its fimbriated  end.  An avascular area in the distal isthmic to proximal ampullary  portion was chosen for ring placement.  An excellent knuckle of tube was  noted be contained within the ring.  Excellent blanching of tissue was  noted.  Appropriate photos were obtained.  At this point, the patient  was felt to have benefited maximally from this portion of the procedure.  The abdominal instruments were removed and pneumoperitoneum evacuated.  Fascial defects were closed with 0 Vicryl in an interrupted fashion.  The skin was closed with 3-0 Monocryl in subcuticular fashion.  Next,   approximately 10 mL of 0.5% Marcaine with 1:200,000 concentration of  epinephrine were instilled into the abdominal incisions to aid in  postoperative analgesia.   Attention was then turned to the vaginal portion of the procedure.  Posterior weighted retractor was once again placed.  The Jarcho uterine  manipulator was removed.  The single-tooth tenaculum remained in place.  Prior to placement of the hysteroscope, an attempt was made to remove  the Mirena IUD with Randall stone forceps.  The attempt was successful,  thus obviating the need for hysteroscopy.  The IUD was submitted to  pathology for gross inspection.   At this point, the patient was felt to have benefited maximally from the  surgical procedure.  The vaginal instruments were removed, hemostasis  noted, and the procedure terminated.  The patient tolerated the  procedure extremely well and was returned to the recovery room in  condition.      James A. Ashley Royalty, M.D.  Electronically Signed     JAM/MEDQ  D:  01/21/2007  T:  01/21/2007  Job:  [id number]

## 2011-01-16 NOTE — Procedures (Signed)
CHIEF COMPLAINT:  An 25 year old primigravida who had a seizure just prior  to epidural anesthesia and then a seizure after delivery of her baby. The  patient had moderate hypertension. She has a CT scan that shows evidence of  reversible ischemic process known as PRES syndrome. Study is being done to  look for presence of seizure disorder.   DESCRIPTION OF PROCEDURE:  The tracing is carried out of 32-channel digital  Cadwell recorder reformatted to 16 channel montages with one devoted to EKG.  The patient was awake during the recording. The [employer] 10/20 system  lead placement used.   DESCRIPTION OF FINDINGS:  Dominant frequency is 8-9 Hz rhythmic 20-30  microvolt activity that is broadly distributed. Superimposed upon this in  the frontal regions is under 10 microvolt beta range activity. Occasional  rhythmic and semirhythmic theta range activity is seen as the patient  becomes drowsy. Light natural sleep was not achieved. Activating procedures  with hyperventilation caused no significant change in background other than  arousal. Intermittent photic stimulation was not carried out.   EKG showed a regular sinus rhythm with ventricular response of 90 beats per  minute.   IMPRESSION:  Essentially normal waking and drowsy record.       WUJ:[id number]  D:  02/02/2005 21:29:18  T:  02/02/2005 23:04:27  Job #:  [id number]   cc:   Fayrene Fearing A. Ashley Royalty, M.D.  [address]  [place], [place] [address]  Fax: [phone]

## 2011-01-16 NOTE — H&P (Signed)
NAME:  [name], [name]              ACCOUNT NO.:  [id number]   MEDICAL RECORD NO.:  [id number]          PATIENT TYPE:  AMB   LOCATION:                                [facility]ILITY:  [facility]   PHYSICIAN:  James A. Ashley Royalty, M.D.DATE OF BIRTH:  1986/03/26   DATE OF ADMISSION:  01/21/2007  DATE OF DISCHARGE:                              HISTORY & PHYSICAL   This is a 25 year old gravida 2, para 2 who presented April 2008 with  desire for tubal sterilization procedure and removal of her Mirena IUD.  The IUD string was not visualized at the time of the pelvic exam and  subsequent ultrasound confirmed that it is present in the uterus.  The  patient is hence for hysteroscopic removal of the Mirena IUD as well as  laparoscopic tubal sterilization procedure.   MEDICATIONS:  None.   PAST MEDICAL HISTORY:  Medical: Negative.  Surgical: Negative.   ALLERGIES:  None.   Family history is positive for diabetes.   SOCIAL HISTORY:  The patient denies use of tobacco or significant  alcohol.   REVIEW OF SYSTEMS:  Noncontributory.   PHYSICAL EXAMINATION:  GENERAL: Well-developed, well-nourished, pleasant  white female in no acute distress.  VITAL SIGNS: Afebrile.  Vital signs stable.  CHEST:  Lungs are clear.  CARDIAC: Regular rate and rhythm.  BREAST EXAM:  Deferred.  ABDOMEN: Soft and nontender.  MUSCULOSKELETAL:  No edema.  PELVIC:  Please see most recent office evaluation.  EXTERNAL GENITALIA:  Within normal limits.  Vagina and cervix without  gross lesions.  Mirena IUD string is not visualized.  Bimanual  examination reveals the uterus to be approximately 8 x 4 x 4.4 cm and no  adnexal masses are palpable.   IMPRESSION:  1. Desire for removal of Mirena IUD.  2. Desire for attempt at permanent surgical sterilization.   PLAN:  1. Diagnostic/operative hysteroscopy with removal of Mirena IUD.  2. Diagnostic/operative laparoscopy with bilateral tubal sterilization      procedure.  The risks and  benefits were fully discussed with the      patient.  Permanency and failure rates of various techniques      including but not limited to Falope ring, bipolar cautery and mini      laparotomy with partial salpingectomy discussed and accepted.  The      patient also understands the possible need for exploratory      laparotomy or unilateral salpingo-oophorectomy.      The patient states she understands and accepts.  We also discussed      some intermittent dyspareunia and possibly endometriosis. She      understands that it is possible we may use the laser to eradicate      endometriosis as well. She states she understands and accepts that      as well.      James A. Ashley Royalty, M.D.  Electronically Signed     JAM/MEDQ  D:  01/21/2007  T:  01/21/2007  Job:  [id number]

## 2011-06-17 ENCOUNTER — Ambulatory Visit (INDEPENDENT_AMBULATORY_CARE_PROVIDER_SITE_OTHER)

## 2011-06-17 DIAGNOSIS — Z23 Encounter for immunization: Secondary | ICD-10-CM

## 2011-08-06 ENCOUNTER — Encounter: Admitting: Family Medicine

## 2011-08-18 ENCOUNTER — Encounter: Admitting: Family Medicine

## 2011-08-19 ENCOUNTER — Encounter: Admitting: Family Medicine

## 2011-08-26 ENCOUNTER — Other Ambulatory Visit (HOSPITAL_COMMUNITY)
Admission: RE | Admit: 2011-08-26 | Discharge: 2011-08-26 | Disposition: A | Discharge status: Home / Self Care | Source: Ambulatory Visit | Attending: Family Medicine | Admitting: Family Medicine

## 2011-08-26 ENCOUNTER — Ambulatory Visit (INDEPENDENT_AMBULATORY_CARE_PROVIDER_SITE_OTHER): Admitting: Family Medicine

## 2011-08-26 ENCOUNTER — Encounter: Payer: Self-pay | Admitting: Family Medicine

## 2011-08-26 VITALS — BP 110/70 | HR 66 | Temp 98.5°F | Ht 60.0 in | Wt 117.5 lb

## 2011-08-26 DIAGNOSIS — Z01419 Encounter for gynecological examination (general) (routine) without abnormal findings: Secondary | ICD-10-CM | POA: Insufficient documentation

## 2011-08-26 DIAGNOSIS — Z1159 Encounter for screening for other viral diseases: Secondary | ICD-10-CM | POA: Insufficient documentation

## 2011-08-26 DIAGNOSIS — Z0000 Encounter for general adult medical examination without abnormal findings: Secondary | ICD-10-CM

## 2011-08-26 DIAGNOSIS — Z136 Encounter for screening for cardiovascular disorders: Secondary | ICD-10-CM

## 2011-08-26 DIAGNOSIS — E559 Vitamin D deficiency, unspecified: Secondary | ICD-10-CM

## 2011-08-26 DIAGNOSIS — R5381 Other malaise: Secondary | ICD-10-CM

## 2011-08-26 DIAGNOSIS — R5383 Other fatigue: Secondary | ICD-10-CM | POA: Insufficient documentation

## 2011-08-26 LAB — CBC WITH DIFFERENTIAL/PLATELET
Basophils Relative: 0.7 % (ref 0.0–3.0)
Eosinophils Relative: 6.4 % — ABNORMAL HIGH (ref 0.0–5.0)
Hemoglobin: 12.8 g/dL (ref 12.0–15.0)
Lymphocytes Relative: 37.5 % (ref 12.0–46.0)
MCHC: 33.4 g/dL (ref 30.0–36.0)
MCV: 83.8 fl (ref 78.0–100.0)
Neutro Abs: 2.1 10*3/uL (ref 1.4–7.7)
Neutrophils Relative %: 46.1 % (ref 43.0–77.0)
RBC: 4.56 Mil/uL (ref 3.87–5.11)
WBC: 4.7 10*3/uL (ref 4.5–10.5)

## 2011-08-26 LAB — TSH: TSH: 1.1 u[IU]/mL (ref 0.35–5.50)

## 2011-08-26 LAB — LIPID PANEL
Cholesterol: 192 mg/dL (ref 0–200)
HDL: 58.8 mg/dL (ref >39.00)
VLDL: 7.8 mg/dL (ref 0.0–40.0)

## 2011-08-26 LAB — BASIC METABOLIC PANEL
Calcium: 9.1 mg/dL (ref 8.4–10.5)
GFR: 163.39 mL/min (ref >60.00)
Glucose, Bld: 89 mg/dL (ref 70–99)
Potassium: 4.4 mEq/L (ref 3.5–5.1)
Sodium: 143 mEq/L (ref 135–145)

## 2011-08-26 NOTE — Patient Instructions (Signed)
Good to see you. We will contact you with your lab results. Have a Happy New Year!

## 2011-08-26 NOTE — Progress Notes (Signed)
Subjective:    Patient ID: [name], female    DOB: 1985/12/11, 25 y.o.   MRN: [id number]  HPI  Well woman- G2P2, one abnormal pap smear less than 5 years ago.  Last two pap smears were normal, most recently 07/2010. Sexually active with husband only.  Would like repeat pap smear today.  H/o vit d deficiency-  Has been more fatigued at end of day lately. No SOB, no CP. Denies any anxiety or depression. Denies any other symptoms of thyroid dysfunction.    Review of Systems See HPI Patient reports no  vision/ hearing changes,anorexia, weight change, fever ,adenopathy, persistant / recurrent hoarseness, swallowing issues, chest pain, edema,persistant / recurrent cough, hemoptysis, dyspnea(rest, exertional, paroxysmal nocturnal), gastrointestinal  bleeding (melena, rectal bleeding), abdominal pain, excessive heart burn, GU symptoms(dysuria, hematuria, pyuria, voiding/incontinence  Issues) syncope, focal weakness, severe memory loss, concerning skin lesions, depression, anxiety, abnormal bruising/bleeding, major joint swelling, breast masses or abnormal vaginal bleeding.       Objective:   Physical Exam BP 110/70  Pulse 66  Temp(Src) 98.5 F (36.9 C) (Oral)  Ht 5' (1.524 m)  Wt 117 lb 8 oz (53.298 kg)  BMI 22.95 kg/m2  LMP 08/05/2011  General:  Well-developed,well-nourished,in no acute distress; alert,appropriate and cooperative throughout examination Head:  normocephalic and atraumatic.   Eyes:  vision grossly intact, pupils equal, pupils round, and pupils reactive to light.   Ears:  R ear normal and L ear normal.   Nose:  no external deformity.   Mouth:  good dentition.   Neck:  No deformities, masses, or tenderness noted. Breasts:  No mass, nodules, thickening, tenderness, bulging, retraction, inflamation, nipple discharge or skin changes noted.   Lungs:  Normal respiratory effort, chest expands symmetrically. Lungs are clear to auscultation, no crackles or  wheezes. Heart:  Normal rate and regular rhythm. S1 and S2 normal without gallop, murmur, click, rub or other extra sounds. Abdomen:  Bowel sounds positive,abdomen soft and non-tender without masses, organomegaly or hernias noted. Rectal:  no external abnormalities.   Genitalia:  Pelvic Exam:        External: normal female genitalia without lesions or masses        Vagina: normal without lesions or masses        Cervix: normal without lesions or masses        Adnexa: normal bimanual exam without masses or fullness        Uterus: normal by palpation        Pap smear: performed Msk:  No deformity or scoliosis noted of thoracic or lumbar spine.   Extremities:  No clubbing, cyanosis, edema, or deformity noted with normal full range of motion of all joints.   Neurologic:  alert & oriented X3 and gait normal.   Skin:  Intact without suspicious lesions or rashes Cervical Nodes:  No lymphadenopathy noted Axillary Nodes:  No palpable lymphadenopathy Psych:  Cognition and judgment appear intact. Alert and cooperative with normal attention span and concentration. No apparent delusions, illusions, hallucinations     Assessment & Plan:   1. Routine general medical examination at a health care facility   Reviewed preventive care protocols, scheduled due services, and updated immunizations Discussed nutrition, exercise, diet, and healthy lifestyle.  Basic Metabolic Panel (BMET), Cytology -Pap Smear Lipid Profile  2. Fatigue  Possibly due to increased work demand and worsening vit d deficiency.  Recheck vit D and other labs to rule out other causes. TSH, T4, free, CBC w/Diff  3. Vitamin D deficiency  Vitamin D (25 hydroxy)

## 2011-08-27 ENCOUNTER — Other Ambulatory Visit: Payer: Self-pay | Admitting: *Deleted

## 2011-08-27 LAB — VITAMIN D 25 HYDROXY (VIT D DEFICIENCY, FRACTURES): Vit D, 25-Hydroxy: 27 ng/mL — ABNORMAL LOW (ref 30–89)

## 2011-08-27 MED ORDER — VITAMIN D3 1.25 MG (50000 UT) PO CAPS: 1.0000 | ORAL_CAPSULE | ORAL | Status: DC

## 2011-09-07 ENCOUNTER — Encounter: Payer: Self-pay | Admitting: *Deleted

## 2012-05-31 ENCOUNTER — Encounter: Payer: Self-pay | Admitting: Family Medicine

## 2012-05-31 ENCOUNTER — Ambulatory Visit (INDEPENDENT_AMBULATORY_CARE_PROVIDER_SITE_OTHER): Payer: 59 | Admitting: Family Medicine

## 2012-05-31 VITALS — BP 102/70 | HR 72 | Temp 98.3°F | Wt 122.0 lb

## 2012-05-31 DIAGNOSIS — M62838 Other muscle spasm: Secondary | ICD-10-CM | POA: Insufficient documentation

## 2012-05-31 MED ORDER — CYCLOBENZAPRINE HCL 5 MG PO TABS: 5.0000 mg | ORAL_TABLET | Freq: Three times a day (TID) | ORAL | Status: DC | PRN

## 2012-05-31 MED ORDER — HYDROCODONE-ACETAMINOPHEN 5-500 MG PO TABS: 1.0000 | ORAL_TABLET | Freq: Three times a day (TID) | ORAL | Status: DC | PRN

## 2012-05-31 NOTE — Progress Notes (Signed)
SUBJECTIVE:  [name] is a 26 y.o. female who complains of right neck pain 4 day(s) ago. The pain is positional with movement of neck without radiation of pain down the arms. Mechanism of injury: unknown- woke up with this pain.  Symptoms have been constant since that time. Prior history of neck problems: no prior neck problems. There is no numbness, tingling, weakness in the arms.  Patient Active Problem List  Diagnosis  . VITAMIN D DEFICIENCY  . LOW BACK PAIN  . BACK STRAIN, LUMBAR  . Routine general medical examination at a health care facility  . Fatigue  . Trapezius muscle spasm   Past Medical History  Diagnosis Date  . Low back pain    Past Surgical History  Procedure Date  . Tubal ligation    History  Substance Use Topics  . Smoking status: Former Games developer  . Smokeless tobacco: Not on file  . Alcohol Use: Yes   No family history on file. No Known Allergies Current Outpatient Prescriptions on File Prior to Visit  Medication Sig Dispense Refill  . Cholecalciferol (VITAMIN D3) 50000 UNITS CAPS Take 1 capsule by mouth once a week.  6 capsule  0   The PMH, PSH, Social History, Family History, Medications, and allergies have been reviewed in [facility], and have been updated if relevant.  OBJECTIVE: BP 102/70  Pulse 72  Temp 98.3 F (36.8 C)  Wt 122 lb (55.339 kg)  Vital signs as noted above. Patient appears to be in mild to moderate pain.  Neck exam: tenderness over trapezial muscles. X-Ray: not indicated.  ASSESSMENT:  cervical strain  PLAN: rest the injured area as much as practical, flexeril as needed and vicodin as needed for severe pain. Consider Physical Therapy and XRay studies if not improving.  Call or return to clinic prn if these symptoms worsen or fail to improve as anticipated.

## 2012-05-31 NOTE — Patient Instructions (Addendum)
Good to see you. Please take flexeril as directed, vicodin as needed for severe pain. Both will make you sleepy--please do not take together.  Continue with heat. Start exercises as directed.  Call me later this week with an update.

## 2012-09-01 ENCOUNTER — Encounter: Admitting: Family Medicine

## 2012-09-23 ENCOUNTER — Telehealth: Payer: Self-pay | Admitting: Family Medicine

## 2012-09-23 NOTE — Telephone Encounter (Signed)
Patient could not come today for an OV.  I offered the Saturday clinic but she said she couldn't go that far, she already drives 30 minutes to see Dr. Dayton Martes.  She says she will try to schedule an appt with Dr. Dayton Martes on Monday.

## 2012-09-23 NOTE — Telephone Encounter (Signed)
Patient Information:  Caller Name: Shenea  Phone: [phone]  Patient: [name], [name]  Gender: Female  DOB: 1985/11/21  Age: 27 Years  PCP: Ruthe Mannan [facility])  Pregnant: No  Office Follow Up:  Does the office need to follow up with this patient?: Yes  Instructions For The Office: PLEASE RETURN CALL TO PATIENT AT [phone] FOR POSSIBLE WORK IN APPT. 09/23/12.  RN Note:  Patient states she developed intermittent nausea/vomiting. Onset 09/14/12. States sx resolved but recurred. States emesis X 4-5X 09/23/12. Denies diarrhea. States soft, brown bowel movement 09/23/12. Denies headache. No nuccal rigidity. Urinating nomrally for patient. Mucous membranes moist. Care advice and diet advice given per guidelines. Call back parameters reviewed. Patient verbalizes understanding. No appts. available for 09/23/12. Patient declines an appt. at another office location. PLEASE RETURN CALL TO PATIENT AT [phone] FOR POSSIBLE WORK IN APPT. 09/23/12.  Symptoms  Reason For Call & Symptoms: Nausea/Vomiting  Reviewed Health History In EMR: Yes  Reviewed Medications In EMR: Yes  Reviewed Allergies In EMR: Yes  Reviewed Surgeries / Procedures: Yes  Date of Onset of Symptoms: 09/14/2012 OB / GYN:  LMP: 09/15/2012  Guideline(s) Used:  Vomiting  Disposition Per Guideline:   See Today in Office  Reason For Disposition Reached:   Vomiting lasts > 48 hours  Advice Given:  Reassurance:  Vomiting can be caused by many types of illnesses. It can be caused by a stomach flu virus. It can be caused by eating or drinking something that disagreed with your stomach.  Clear Liquids:  Sip water or a rehydration drink (e.g., Gatorade or Powerade).  Other options: 1/2 strength flat lemon-lime soda or ginger ale.  After 4 hours without vomiting, increase the amount.  Call Back If:  Signs of dehydration occur  You have more questions  You become worse.

## 2012-09-23 NOTE — Telephone Encounter (Signed)
Please see about adding her on.  Thanks.

## 2012-10-25 ENCOUNTER — Ambulatory Visit: Payer: Self-pay | Admitting: Family Medicine

## 2012-10-25 ENCOUNTER — Telehealth: Payer: Self-pay

## 2012-10-25 ENCOUNTER — Encounter: Payer: Self-pay | Admitting: Family Medicine

## 2012-10-25 ENCOUNTER — Ambulatory Visit (INDEPENDENT_AMBULATORY_CARE_PROVIDER_SITE_OTHER): Payer: 59 | Admitting: Family Medicine

## 2012-10-25 VITALS — BP 98/60 | HR 68 | Temp 97.9°F | Ht 60.0 in | Wt 113.0 lb

## 2012-10-25 DIAGNOSIS — Z136 Encounter for screening for cardiovascular disorders: Secondary | ICD-10-CM

## 2012-10-25 DIAGNOSIS — E559 Vitamin D deficiency, unspecified: Secondary | ICD-10-CM

## 2012-10-25 DIAGNOSIS — Z0000 Encounter for general adult medical examination without abnormal findings: Secondary | ICD-10-CM

## 2012-10-25 DIAGNOSIS — R5383 Other fatigue: Secondary | ICD-10-CM

## 2012-10-25 DIAGNOSIS — R112 Nausea with vomiting, unspecified: Secondary | ICD-10-CM

## 2012-10-25 DIAGNOSIS — R5381 Other malaise: Secondary | ICD-10-CM

## 2012-10-25 DIAGNOSIS — M549 Dorsalgia, unspecified: Secondary | ICD-10-CM

## 2012-10-25 DIAGNOSIS — Z113 Encounter for screening for infections with a predominantly sexual mode of transmission: Secondary | ICD-10-CM

## 2012-10-25 NOTE — Telephone Encounter (Signed)
Lupita Leash at [facility] [place] called report; basically normal; common bile duct appeared prominent but when turned to cube appeared normal; 4.21mm. No intrahepatic ducts dilated; GB appeared normal. Pt has already gone home and will wait for call from Dr Elmer Sow office.

## 2012-10-25 NOTE — Patient Instructions (Addendum)
Good to see you, [name].  Please stop by to see Shirlee Limerick after you to go the lab to set up your ultrasound. We will call you with your lab and ultrasound results.

## 2012-10-25 NOTE — Progress Notes (Signed)
Subjective:    Patient ID: [name], female    DOB: 06-20-1986, 27 y.o.   MRN: [id number]  HPI  Well woman- G2P2 here for CPX with multiple complaints today. Sexually active with husband only.  Normal pap smear done here in 08/2011 (see Epic).  She states that she needs to have them done yearly due to abnormal cells in past.    H/o vit d deficiency-   Has been more fatigued at end of day lately. No SOB, no CP. Denies any anxiety or depression. Denies any other symptoms of thyroid dysfunction.  Epigastric pain with nausea, vomiting and diarrhea. Ongoing for over a month. Each time she eats she is either nauseated or vomits.  Has lost weight due to this.  No blood in her stool.  Does have h/o IBS.  LMP last week. Wt Readings from Last 3 Encounters:  10/25/12 113 lb (51.256 kg)  05/31/12 122 lb (55.339 kg)  08/26/11 117 lb 8 oz (53.298 kg)   Also fell on her back last month- has had some intermittent back pain since.  Patient Active Problem List  Diagnosis  . VITAMIN D DEFICIENCY  . LOW BACK PAIN  . BACK STRAIN, LUMBAR  . Routine general medical examination at a health care facility  . Fatigue  . Trapezius muscle spasm   Past Medical History  Diagnosis Date  . Low back pain    Past Surgical History  Procedure Laterality Date  . Tubal ligation     History  Substance Use Topics  . Smoking status: Former Games developer  . Smokeless tobacco: Not on file  . Alcohol Use: Yes   No family history on file. No Known Allergies Current Outpatient Prescriptions on File Prior to Visit  Medication Sig Dispense Refill  . cyclobenzaprine (FLEXERIL) 5 MG tablet Take 1 tablet (5 mg total) by mouth 3 (three) times daily as needed for muscle spasms.  30 tablet  1  . HYDROcodone-acetaminophen (VICODIN) 5-500 MG per tablet Take 1 tablet by mouth every 8 (eight) hours as needed for pain.  30 tablet  0   No current facility-administered medications on file prior to visit.   The PMH,  PSH, Social History, Family History, Medications, and allergies have been reviewed in [facility], and have been updated if relevant.   Review of Systems See HPI Patient reports no  vision/ hearing changes,anorexia, weight change, fever ,adenopathy, persistant / recurrent hoarseness, swallowing issues, chest pain, edema,persistant / recurrent cough, hemoptysis, dyspnea(rest, exertional, paroxysmal nocturnal), gastrointestinal  bleeding (melena, rectal bleeding), abdominal pain, excessive heart burn, GU symptoms(dysuria, hematuria, pyuria, voiding/incontinence  Issues) syncope, focal weakness, severe memory loss, concerning skin lesions, depression, anxiety, abnormal bruising/bleeding, major joint swelling, breast masses or abnormal vaginal bleeding.       Objective:   Physical Exam BP 98/60  Pulse 68  Temp(Src) 97.9 F (36.6 C)  Ht 5' (1.524 m)  Wt 113 lb (51.256 kg)  BMI 22.07 kg/m2  General:  Well-developed,well-nourished,in no acute distress; alert,appropriate and cooperative throughout examination Head:  normocephalic and atraumatic.   Eyes:  vision grossly intact, pupils equal, pupils round, and pupils reactive to light.   Ears:  R ear normal and L ear normal.   Nose:  no external deformity.   Mouth:  good dentition.   Neck:  No deformities, masses, or tenderness noted. Breasts:  No mass, nodules, thickening, tenderness, bulging, retraction, inflamation, nipple discharge or skin changes noted.   Lungs:  Normal respiratory effort, chest expands symmetrically.  Lungs are clear to auscultation, no crackles or wheezes. Heart:  Normal rate and regular rhythm. S1 and S2 normal without gallop, murmur, click, rub or other extra sounds. Abdomen:  Bowel sounds positive,abdomen soft and non-tender without masses, organomegaly or hernias noted. Rectal:  no external abnormalities.   Genitalia:  Pelvic Exam:        External: normal female genitalia without lesions or masses        Vagina: normal  without lesions or masses        Cervix: normal without lesions or masses        Adnexa: normal bimanual exam without masses or fullness        Uterus: normal by palpation        Pap smear: performed Msk:  No deformity or scoliosis noted of thoracic or lumbar spine.   Extremities:  No clubbing, cyanosis, edema, or deformity noted with normal full range of motion of all joints.   Neurologic:  alert & oriented X3 and gait normal.   Skin:  Intact without suspicious lesions or rashes Cervical Nodes:  No lymphadenopathy noted Axillary Nodes:  No palpable lymphadenopathy Psych:  Cognition and judgment appear intact. Alert and cooperative with normal attention span and concentration. No apparent delusions, illusions, hallucinations     Assessment & Plan:   1. Routine general medical examination at a health care facility Reviewed preventive care protocols, scheduled due services, and updated immunizations Discussed nutrition, exercise, diet, and healthy lifestyle.  - CBC with Differential - Comprehensive metabolic panel  2. VITAMIN D DEFICIENCY  - Vitamin D, 25-hydroxy  3. Fatigue Likely due to vit D Def.  4. Nausea with vomiting With weight loss- ? Biliary colic.  Check labs today, ultrasound of abdomen.  If work up neg, will refer to GI. - US Abdomen Complete; Future - Lipase  5. Back pain Likely muscle strain.  Spine NTTP.  Reassurance provided.  6. Screening for STD (sexually transmitted disease)  - HIV Antibody - RPR  7. Screening for ischemic heart disease  - Lipid Panel

## 2012-10-25 NOTE — Telephone Encounter (Signed)
Please let pt know call report states ultrasound was normal- gallbladder looked normal.  I will refer to GI for further work up. Referral entered.

## 2012-10-25 NOTE — Telephone Encounter (Signed)
Advised patient of results.  

## 2012-10-26 LAB — COMPREHENSIVE METABOLIC PANEL
AST: 10 IU/L (ref 0–40)
Albumin/Globulin Ratio: 2 (ref 1.1–2.5)
BUN/Creatinine Ratio: 12 (ref 8–20)
Calcium: 9.9 mg/dL (ref 8.7–10.2)
Creatinine, Ser: 0.74 mg/dL (ref 0.57–1.00)
GFR calc Af Amer: 129 mL/min/{1.73_m2} (ref >59)
GFR calc non Af Amer: 112 mL/min/{1.73_m2} (ref >59)
Globulin, Total: 2.3 g/dL (ref 1.5–4.5)
Sodium: 140 mmol/L (ref 134–144)
Total Bilirubin: 0.9 mg/dL (ref 0.0–1.2)

## 2012-10-26 LAB — RPR: RPR: NONREACTIVE

## 2012-10-26 LAB — CBC WITH DIFFERENTIAL/PLATELET
Basophils Absolute: 0 10*3/uL (ref 0.0–0.2)
Basos: 0 % (ref 0–3)
Eosinophils Absolute: 0.2 10*3/uL (ref 0.0–0.4)
Hemoglobin: 14.4 g/dL (ref 11.1–15.9)
Lymphocytes Absolute: 1.4 10*3/uL (ref 0.7–3.1)
MCH: 28.4 pg (ref 26.6–33.0)
MCHC: 33 g/dL (ref 31.5–35.7)
MCV: 86 fL (ref 79–97)
Monocytes Absolute: 0.4 10*3/uL (ref 0.1–0.9)
Neutrophils Absolute: 2.6 10*3/uL (ref 1.4–7.0)
RBC: 5.07 x10E6/uL (ref 3.77–5.28)

## 2012-10-26 LAB — LIPID PANEL
Chol/HDL Ratio: 3.2 ratio units (ref 0.0–4.4)
HDL: 59 mg/dL (ref >39)
Triglycerides: 53 mg/dL (ref 0–149)
VLDL Cholesterol Cal: 11 mg/dL (ref 5–40)

## 2012-10-27 ENCOUNTER — Other Ambulatory Visit: Payer: Self-pay | Admitting: *Deleted

## 2012-10-27 MED ORDER — ERGOCALCIFEROL 1.25 MG (50000 UT) PO CAPS: 50000.0000 [IU] | ORAL_CAPSULE | ORAL | Status: DC

## 2012-11-01 ENCOUNTER — Encounter: Payer: Self-pay | Admitting: Gastroenterology

## 2012-11-01 ENCOUNTER — Encounter: Payer: Self-pay | Admitting: Family Medicine

## 2012-11-03 ENCOUNTER — Telehealth: Payer: Self-pay

## 2012-11-03 MED ORDER — CYCLOBENZAPRINE HCL 5 MG PO TABS: 5.0000 mg | ORAL_TABLET | Freq: Three times a day (TID) | ORAL | Status: DC | PRN

## 2012-11-03 NOTE — Telephone Encounter (Signed)
I would recommend PT.  We know that NSAIDs, exercises and support care such as heat are most helpful for back pain.

## 2012-11-03 NOTE — Telephone Encounter (Signed)
Advised patient.  She says she cant afford PT.  I advised her to try warm compresses on back to see if that helps.  She said she would try that, along with ibuprofen.

## 2012-11-03 NOTE — Telephone Encounter (Signed)
I can send in short course of flexeril to use with Ibuprofen and other supportive care such as heat, etc.  If no improvement, she needs to be seen.  I would also recommend PT if no improvement.

## 2012-11-03 NOTE — Telephone Encounter (Signed)
Pt was seen 10/25/12 and discussed with Dr Dayton Martes about pt falling down steps in January; pt said pain in back no better. Pt said pt has hx of neck spasms before fall. Now pt said neck pain on and off with restriction of movement of neck. Pt said upper back pain from bra line up the back. Pt has taken 400 mg of Ibuprofen with no relief and pt  request med for pain sent to [facility] [address].Please advise. Pt did not want to schedule appt to be seen.

## 2012-11-03 NOTE — Telephone Encounter (Signed)
Advised patient as instructed.  She says flexeril doesn't work for her, do you have another suggestion?

## 2012-11-16 ENCOUNTER — Telehealth: Payer: Self-pay | Admitting: Family Medicine

## 2012-11-17 NOTE — Telephone Encounter (Signed)
Error

## 2012-11-22 ENCOUNTER — Encounter: Payer: Self-pay | Admitting: Gastroenterology

## 2012-11-22 ENCOUNTER — Ambulatory Visit (INDEPENDENT_AMBULATORY_CARE_PROVIDER_SITE_OTHER): Payer: 59 | Admitting: Gastroenterology

## 2012-11-22 VITALS — BP 98/60 | HR 72 | Ht 60.0 in | Wt 115.6 lb

## 2012-11-22 DIAGNOSIS — R109 Unspecified abdominal pain: Secondary | ICD-10-CM

## 2012-11-22 DIAGNOSIS — R112 Nausea with vomiting, unspecified: Secondary | ICD-10-CM

## 2012-11-22 DIAGNOSIS — K921 Melena: Secondary | ICD-10-CM | POA: Insufficient documentation

## 2012-11-22 MED ORDER — RANITIDINE HCL 150 MG PO CAPS: 150.0000 mg | ORAL_CAPSULE | Freq: Two times a day (BID) | ORAL | Status: DC

## 2012-11-22 NOTE — Assessment & Plan Note (Signed)
Patient claims to the past several black stools. Today's exam is Hemoccult negative.  Recommendations #1 followup Hemoccults

## 2012-11-22 NOTE — Progress Notes (Signed)
History of Present Illness: Pleasant 27 year old white female referred at the request of Dr. Dayton Martes for evaluation of nausea and abdominal pain. For the past 2 months she's been complaining of nausea, especially in the mornings. She's had several episodes of vomiting after eating a few bites. She  is also complaining of upper abdominal discomfort. Vomiting has subsided but she still has nausea, particularly in the mornings. On at least 3 occasions she also noted black stools. She's had no frank rectal bleeding. She is on no gastric irritants including nonsteroidals. There is no history of peptic ulcer disease. She recently had a full menstrual period and is quite certain that she is not pregnant. She has had her tubes tied.    Past Medical History  Diagnosis Date  . Low back pain   . Irritable bowel syndrome    Past Surgical History  Procedure Laterality Date  . Tubal ligation    . Cesarean section      x2   family history includes Diabetes in her maternal grandmother, maternal uncles, and mother. Current Outpatient Prescriptions  Medication Sig Dispense Refill  . ergocalciferol (VITAMIN D2) 50000 UNITS capsule Take 1 capsule (50,000 Units total) by mouth once a week. Times 6 weeks.  6 capsule  0   No current facility-administered medications for this visit.   Allergies as of 11/22/2012  . (No Known Allergies)    reports that she has quit smoking. She has never used smokeless tobacco. She reports that  drinks alcohol. She reports that she does not use illicit drugs.     Review of Systems: Pertinent positive and negative review of systems were noted in the above HPI section. All other review of systems were otherwise negative.  Vital signs were reviewed in today's medical record Physical Exam: General: Well developed , well nourished, no acute distress Skin: anicteric Head: Normocephalic and atraumatic Eyes:  sclerae anicteric, EOMI Ears: Normal auditory acuity Mouth: No deformity  or lesions Neck: Supple, no masses or thyromegaly Lungs: Clear throughout to auscultation Heart: Regular rate and rhythm; no murmurs, rubs or bruits Abdomen: Soft, non tender and non distended. No masses, hepatosplenomegaly or hernias noted. Normal Bowel sounds Rectal: There are no rectal masses. Stool is Hemoccult negative. Musculoskeletal: Symmetrical with no gross deformities  Skin: No lesions on visible extremities Pulses:  Normal pulses noted Extremities: No clubbing, cyanosis, edema or deformities noted Neurological: Alert oriented x 4, grossly nonfocal Cervical Nodes:  No significant cervical adenopathy Inguinal Nodes: No significant inguinal adenopathy Psychological:  Alert and cooperative. Normal mood and affect

## 2012-11-22 NOTE — Patient Instructions (Addendum)
Go to the basement today to pick up your lab kit Call back in 5 days to let us know how you are doing We are sending a script to your pharmacy

## 2012-11-22 NOTE — Assessment & Plan Note (Signed)
Symptoms could be do to ulcer or nonulcer dyspepsia. Patient is certain that she is not pregnant (status post tubal ligation ).  Symptoms from chronic cholecystitis are much less likely.  Recommendations #1 trial of ranitidine 150 mg twice a day #2 if not improved would consider upper endoscopy

## 2013-01-03 ENCOUNTER — Other Ambulatory Visit: Payer: 59

## 2013-01-05 ENCOUNTER — Telehealth: Payer: Self-pay | Admitting: Family Medicine

## 2013-01-05 NOTE — Telephone Encounter (Signed)
Patient Information:  Caller Name: Margert  Phone: [phone]  Patient: [name]  Gender: Female  DOB: 12-07-1985  Age: 27 Years  PCP: Ruthe Mannan [facility])  Pregnant: No  Office Follow Up:  Does the office need to follow up with this patient?: Yes  Instructions For The Office: Requesting Rx; declines appt krs/can  RN Note:  back pain continues.  Seen in office in February 2014 but has not improved.  States she needs something for the back pain and spasms. Pain is located in upper back between shoulders.  Per triage protocol, advised appt within 2 weeks; offered appt 01/05/13.  Patient declines appt; wants to speak with Dr. Dayton Martes or have medication called in.  Again advised appt if needing pain medication; declines.  Info to office for provider review/callback.  May reach patient at [phone].  Symptoms  Reason For Call & Symptoms: back pain not improving  Reviewed Health History In EMR: Yes  Reviewed Medications In EMR: Yes  Reviewed Allergies In EMR: Yes  Reviewed Surgeries / Procedures: Yes  Date of Onset of Symptoms: Unknown OB / GYN:  LMP: Unknown  Guideline(s) Used:  Back Pain  Disposition Per Guideline:   See Within 2 Weeks in Office  Reason For Disposition Reached:   Back pain is a chronic symptom (recurrent or ongoing AND lasting > 4 weeks)  Advice Given:  N/A  Patient Refused Recommendation:  Patient Requests Prescription  Requests prescription krs/can

## 2013-01-05 NOTE — Telephone Encounter (Signed)
Needs to be seen for pain meds  

## 2013-01-06 NOTE — Telephone Encounter (Signed)
I do not prescribe hydrocodone for chronic back pain, without h/o back surgery, stenosis, etc.  I'm sorry but she needs to be seen.

## 2013-01-06 NOTE — Telephone Encounter (Signed)
Noted  

## 2013-01-06 NOTE — Telephone Encounter (Signed)
Advised patient.  I suggested to her to take ibuprofen for pain.  She says she's been taking one every morning and it doesn't help.  Told her to take 2 as directed by instructions on her bottle and see if that helps.  Pt said she would try.

## 2013-01-06 NOTE — Telephone Encounter (Signed)
Advised patient.  She says she can't afford office visit.  She has built up doctor bills from her stomach problems.  She's asking if she can have have refill on hydrocodone, which she was prescribed last October. States she's having so much pain now because she had had to be on her feet working 11 hour shifts 3 days in a row this week.

## 2013-11-10 ENCOUNTER — Telehealth: Payer: Self-pay | Admitting: Family Medicine

## 2013-11-10 NOTE — Telephone Encounter (Signed)
Spoke to pt and advised per Dr Dayton MartesAron. Pt refuses to go to either, due to financial reasons

## 2013-11-10 NOTE — Telephone Encounter (Signed)
Patient Information:  Caller Name: [name]  Phone: [phone]  Patient: [name], [name]  Gender: Female  DOB: 06-27-1986  Age: 28 Years  PCP: Ruthe MannanAron, Talia [facility](Family Practice)  Pregnant: No  Office Follow Up:  Does the office need to follow up with this patient?: Yes  Instructions For The Office: "See today in office" per triage but no appts available. Could she be worked in or would the MD prefer her to go to UC or would MD want to prescribe abx? Please call pt back with instructions/recommendations.  RN Note:  Will schedule appt.-----No appts available, sending to office staff pool for f/u  Symptoms  Reason For Call & Symptoms: Urinary pain/burning with couple drops blood/pink color when she wipes. Also having frequency and urgency and noticing stronger urine odor. No fever. No flank pain, abd pain, or back or side pain.  Reviewed Health History In EMR: Yes  Reviewed Medications In EMR: Yes  Reviewed Allergies In EMR: Yes  Reviewed Surgeries / Procedures: Yes  Date of Onset of Symptoms: 11/06/2013  Treatments Tried: Cranberry juice  Treatments Tried Worked: No OB / GYN:  LMP: 10/23/2013  Guideline(s) Used:  Urination Pain - Female  Disposition Per Guideline:   See Today in Office  Reason For Disposition Reached:   Painful urination AND EITHER frequency or urgency  Advice Given:  Fluids:   Drink extra fluids. Drink 8-10 glasses of liquids a day (Reason: to produce a dilute, non-irritating urine).  Warm Saline SITZ Baths to Reduce Pain:  Sit in a warm saline bath for 20 minutes to cleanse the area and to reduce pain. Add 2 oz. of table salt or baking soda to a tub of water.  Patient Will Follow Care Advice:  YES

## 2013-11-10 NOTE — Telephone Encounter (Signed)
Please advise UC or weekend clinic tomorrow.

## 2013-11-14 ENCOUNTER — Ambulatory Visit (INDEPENDENT_AMBULATORY_CARE_PROVIDER_SITE_OTHER): Payer: Commercial Managed Care - PPO | Admitting: Family Medicine

## 2013-11-14 ENCOUNTER — Encounter: Payer: Self-pay | Admitting: Family Medicine

## 2013-11-14 VITALS — BP 102/68 | HR 82 | Temp 98.4°F | Ht 60.0 in | Wt 121.2 lb

## 2013-11-14 DIAGNOSIS — R300 Dysuria: Secondary | ICD-10-CM

## 2013-11-14 DIAGNOSIS — N390 Urinary tract infection, site not specified: Secondary | ICD-10-CM

## 2013-11-14 LAB — POCT URINALYSIS DIPSTICK
BILIRUBIN UA: NEGATIVE
Blood, UA: NEGATIVE
Glucose, UA: NEGATIVE
Ketones, UA: NEGATIVE
NITRITE UA: POSITIVE
PH UA: 7.5
PROTEIN UA: NEGATIVE
Spec Grav, UA: 1.01
Urobilinogen, UA: NEGATIVE

## 2013-11-14 MED ORDER — SULFAMETHOXAZOLE-TMP DS 800-160 MG PO TABS: 1.0000 | ORAL_TABLET | Freq: Two times a day (BID) | ORAL | Status: DC

## 2013-11-14 NOTE — Progress Notes (Signed)
   Subjective:    Patient ID: [name], female    DOB: [birth year], 28 y.o.   MRN: [id number]  Urinary Tract Infection  This is a new problem. The current episode started in the past 7 days. The problem has been gradually improving. The quality of the pain is described as burning. The pain is at a severity of 6/10. The pain is moderate. There has been no fever. She is sexually active. There is no history of pyelonephritis. Associated symptoms include frequency, hematuria and urgency. Pertinent negatives include no chills, discharge, flank pain, hesitancy, nausea, possible pregnancy, sweats or vomiting. Associated symptoms comments: Urine odor. She has tried increased fluids (cranberry) for the symptoms. The treatment provided mild relief. There is no history of catheterization, kidney stones, recurrent UTIs, a single kidney, urinary stasis or a urological procedure.      Review of Systems  Constitutional: Negative for chills.  Gastrointestinal: Negative for nausea and vomiting.  Genitourinary: Positive for urgency, frequency and hematuria. Negative for hesitancy and flank pain.       Objective:   Physical Exam  Constitutional: Vital signs are normal. She appears well-developed and well-nourished. She is cooperative.  Non-toxic appearance. She does not appear ill. No distress.  HENT:  Head: Normocephalic.  Right Ear: Hearing, tympanic membrane, external ear and ear canal normal. Tympanic membrane is not erythematous, not retracted and not bulging.  Left Ear: Hearing, tympanic membrane, external ear and ear canal normal. Tympanic membrane is not erythematous, not retracted and not bulging.  Nose: No mucosal edema or rhinorrhea. Right sinus exhibits no maxillary sinus tenderness and no frontal sinus tenderness. Left sinus exhibits no maxillary sinus tenderness and no frontal sinus tenderness.  Mouth/Throat: Uvula is midline, oropharynx is clear and moist and mucous membranes are normal.    Eyes: Conjunctivae, EOM and lids are normal. Pupils are equal, round, and reactive to light. Lids are everted and swept, no foreign bodies found.  Neck: Trachea normal and normal range of motion. Neck supple. Carotid bruit is not present. No mass and no thyromegaly present.  Cardiovascular: Normal rate, regular rhythm, S1 normal, S2 normal, normal heart sounds, intact distal pulses and normal pulses.  Exam reveals no gallop and no friction rub.   No murmur heard. Pulmonary/Chest: Effort normal and breath sounds normal. Not tachypneic. No respiratory distress. She has no decreased breath sounds. She has no wheezes. She has no rhonchi. She has no rales.  Abdominal: Soft. Normal appearance and bowel sounds are normal. There is no tenderness. There is no CVA tenderness.  Neurological: She is alert.  Skin: Skin is warm, dry and intact. No rash noted.  Psychiatric: Her speech is normal and behavior is normal. Judgment and thought content normal. Her mood appears not anxious. Cognition and memory are normal. She does not exhibit a depressed mood.          Assessment & Plan:

## 2013-11-14 NOTE — Patient Instructions (Signed)
Call if symptoms are not improved at end of antibiotics. Call if fever or flank pain or nausea/vomting making unable to take antibiotics.

## 2013-11-14 NOTE — Assessment & Plan Note (Signed)
Uncomplicated  Treat with 3 days of bactrim.  Push fluids.

## 2013-11-14 NOTE — Progress Notes (Signed)
Pre visit review using our clinic review tool, if applicable. No additional management support is needed unless otherwise documented below in the visit note. 

## 2013-11-16 ENCOUNTER — Ambulatory Visit: Payer: 59 | Admitting: Internal Medicine

## 2014-03-01 ENCOUNTER — Telehealth: Payer: Self-pay | Admitting: Family Medicine

## 2014-03-01 MED ORDER — CYCLOBENZAPRINE HCL 5 MG PO TABS: 5.0000 mg | ORAL_TABLET | Freq: Three times a day (TID) | ORAL | Status: DC | PRN

## 2014-03-01 NOTE — Telephone Encounter (Signed)
Snet in rx as on past med list.  If not getting better in 2 weks, make appt to be seen.

## 2014-03-01 NOTE — Telephone Encounter (Signed)
[name] notified that prescription for flexeril has been sent to her pharmacy. Advised if not getting better in 2 weeks to call and make an appointment to be seen.

## 2014-03-01 NOTE — Telephone Encounter (Signed)
Patient Information:  Caller Name: [name]  Phone: [phone]  Patient: [name], [name]  Gender: Female  DOB: [birth year]  Age: [age] Years  PCP: Ruthe MannanAron, Talia [facility](Family Practice)  Pregnant: No  Office Follow Up:  Does the office need to follow up with this patient?: Yes  Instructions For The Office: Asks for Rx so she does not have to drive to office.  Flexeril was Rx for previous similar symptoms.   Symptoms  Reason For Call & Symptoms: Muscle spasms in neck; onset 02/28/14.  Pain rated at 7 of 10, not relieved by Aspirin.  Emergent symptoms ruled out.  See Within 3 Days in Office per Neck Pian or Stiffness guideline due to Moderate neck pain.  Reviewed Health History In EMR: Yes  Reviewed Medications In EMR: Yes  Reviewed Allergies In EMR: Yes  Reviewed Surgeries / Procedures: Yes  Date of Onset of Symptoms: 02/28/2014  Treatments Tried: Aspirin; heat  Treatments Tried Worked: No OB / GYN:  LMP: 02/16/2014  Guideline(s) Used:  Neck Pain or Stiffness  Disposition Per Guideline:   See Within 3 Days in Office  Reason For Disposition Reached:   Moderate neck pain (e.g., interferes with normal activities like work or school)  Advice Given:  Reassurance:  Prolonged turning of the head or working in an awkward position can cause muscle pain in the back of the neck. With treatment, the pain usually resolves in 1 to 2 weeks.  Here is some care advice that should help.  Apply Cold to the Area Or Heat:   During the first 2 days after a mild injury, apply a cold pack or an ice bag (wrapped in a towel) for 20 minutes four times a day. After 2 days, apply a heating pad or hot water bottle to the most painful area for 20 minutes whenever the pain flares up. Wrap hot water bottles or heating pads in a towel to avoid burns.  Sleep:  Sleep on your back or side, not on your abdomen.  Sleep with a neck collar. Use a foam neck collar (from a pharmacy) OR a small towel wrapped around the neck  (Reason: keep the head from moving too much during sleep).  Pain Medicines:  For pain relief, you can take either acetaminophen, ibuprofen, or naproxen.  They are over-the-counter (OTC) pain drugs. You can buy them at the drugstore.  Good Body Mechanics:  Lifting: Stand close to the object to be lifted. Keep your back straight and lift by bending your legs. Ask for help if needed.  Sleeping: Sleep on a firm mattress.  Computer screen: place at eye level.  Posture: Maintain good posture.  Avoid:   Avoid triggers that overstress the neck such as working with the neck turned or bent backward, carrying heavy objects on the head, carrying heavy objects with one arm (instead of both arms), standing on the head, contact sports or even friendly wrestling.  Call Back If:  You become worse.  Patient Refused Recommendation:  Patient Requests Prescription  Asks for Rx so she does not have to drive to office.  Flexeril was Rx for previous similar symptoms.

## 2015-02-15 ENCOUNTER — Ambulatory Visit (INDEPENDENT_AMBULATORY_CARE_PROVIDER_SITE_OTHER): Payer: Commercial Managed Care - PPO | Admitting: Primary Care

## 2015-02-15 ENCOUNTER — Encounter: Payer: Self-pay | Admitting: Primary Care

## 2015-02-15 VITALS — BP 122/70 | HR 87 | Temp 98.2°F | Ht 60.0 in | Wt 108.4 lb

## 2015-02-15 DIAGNOSIS — M79674 Pain in right toe(s): Secondary | ICD-10-CM | POA: Diagnosis not present

## 2015-02-15 MED ORDER — INDOMETHACIN 25 MG PO CAPS: 25.0000 mg | ORAL_CAPSULE | Freq: Three times a day (TID) | ORAL | Status: DC

## 2015-02-15 NOTE — Progress Notes (Signed)
Pre visit review using our clinic review tool, if applicable. No additional management support is needed unless otherwise documented below in the visit note. 

## 2015-02-15 NOTE — Progress Notes (Signed)
   Subjective:    Patient ID: [name], female    DOB: [birth year], 29 y.o.   MRN: [id number]  HPI  [name] is a 29 year old female who presents today with a chief complaint of toe pain. The pain is present to her right great toe. She injured her toe one year ago and was told she had a small fracture. She has had occasional pain to the joint of her bilateral great toes over the past year. Yesterday the joint of the right great toe became red, swollen, and extremely painful. Her toe is extremitely tender to touch and cannot wear socks or shoes without pain.  Denies recent injury. She's tried taking tylenol and BC powder without relief.   Review of Systems  Constitutional: Negative for fever.  Musculoskeletal: Positive for joint swelling and arthralgias.       Past Medical History  Diagnosis Date  . Low back pain   . Irritable bowel syndrome     History   Social History  . Marital Status: Married    Spouse Name: N/A  . Number of Children: 2  . Years of Education: N/A   Occupational History  . Manager Unemployed  .  [place]   Social History Main Topics  . Smoking status: Former Games developer  . Smokeless tobacco: Never Used  . Alcohol Use: Yes  . Drug Use: No  . Sexual Activity: Not on file   Other Topics Concern  . Not on file   Social History Narrative    Past Surgical History  Procedure Laterality Date  . Tubal ligation    . Cesarean section      x2    Family History  Problem Relation Age of Onset  . Diabetes Mother   . Diabetes Maternal Grandmother   . Diabetes Maternal Uncle   . Diabetes Maternal Uncle     No Known Allergies  Current Outpatient Prescriptions on File Prior to Visit  Medication Sig Dispense Refill  . ranitidine (ZANTAC) 150 MG capsule Take 150 mg by mouth 2 (two) times daily as needed.     No current facility-administered medications on file prior to visit.    BP 122/70 mmHg  Pulse 87  Temp(Src) 98.2 F (36.8 C) (Oral)   Ht 5' (1.524 m)  Wt 108 lb 6.4 oz (49.17 kg)  BMI 21.17 kg/m2  SpO2 98%  LMP 01/21/2015    Objective:   Physical Exam  Cardiovascular: Normal rate and regular rhythm.   Pulmonary/Chest: Effort normal and breath sounds normal.  Skin: Skin is warm and dry. No rash noted. There is erythema.  Moderate erythema and swelling to joint of right great toe. Very tender upon palpation. No obvious deformity          Assessment & Plan:  Toe pain:  Moderate erythema, redness, tenderness to metatarsal joint of right great toe. No obvious deformity, no recent injury. Suspect gout attack. RX for indomethacin TID x 7 days. Renal function normal.  Uric acid level in 2 weeks to confirm.  Follow up PRN.

## 2015-02-15 NOTE — Patient Instructions (Addendum)
Start indomethacin tablets. Take 1 tablet by mouth three times daily as needed for pain and inflammation.  Schedule a lab only appointment in 2 weeks to check for gout.  I will notify you of your results.  It was nice meeting you!  Gout Gout is an inflammatory arthritis caused by a buildup of uric acid crystals in the joints. Uric acid is a chemical that is normally present in the blood. When the level of uric acid in the blood is too high it can form crystals that deposit in your joints and tissues. This causes joint redness, soreness, and swelling (inflammation). Repeat attacks are common. Over time, uric acid crystals can form into masses (tophi) near a joint, destroying bone and causing disfigurement. Gout is treatable and often preventable. CAUSES  The disease begins with elevated levels of uric acid in the blood. Uric acid is produced by your body when it breaks down a naturally found substance called purines. Certain foods you eat, such as meats and fish, contain high amounts of purines. Causes of an elevated uric acid level include:  Being passed down from parent to child (heredity).  Diseases that cause increased uric acid production (such as obesity, psoriasis, and certain cancers).  Excessive alcohol use.  Diet, especially diets rich in meat and seafood.  Medicines, including certain cancer-fighting medicines (chemotherapy), water pills (diuretics), and aspirin.  Chronic kidney disease. The kidneys are no longer able to remove uric acid well.  Problems with metabolism. Conditions strongly associated with gout include:  Obesity.  High blood pressure.  High cholesterol.  Diabetes. Not everyone with elevated uric acid levels gets gout. It is not understood why some people get gout and others do not. Surgery, joint injury, and eating too much of certain foods are some of the factors that can lead to gout attacks. SYMPTOMS   An attack of gout comes on quickly. It causes  intense pain with redness, swelling, and warmth in a joint.  Fever can occur.  Often, only one joint is involved. Certain joints are more commonly involved:  Base of the big toe.  Knee.  Ankle.  Wrist.  Finger. Without treatment, an attack usually goes away in a few days to weeks. Between attacks, you usually will not have symptoms, which is different from many other forms of arthritis. DIAGNOSIS  Your caregiver will suspect gout based on your symptoms and exam. In some cases, tests may be recommended. The tests may include:  Blood tests.  Urine tests.  X-rays.  Joint fluid exam. This exam requires a needle to remove fluid from the joint (arthrocentesis). Using a microscope, gout is confirmed when uric acid crystals are seen in the joint fluid. TREATMENT  There are two phases to gout treatment: treating the sudden onset (acute) attack and preventing attacks (prophylaxis).  Treatment of an Acute Attack.  Medicines are used. These include anti-inflammatory medicines or steroid medicines.  An injection of steroid medicine into the affected joint is sometimes necessary.  The painful joint is rested. Movement can worsen the arthritis.  You may use warm or cold treatments on painful joints, depending which works best for you.  Treatment to Prevent Attacks.  If you suffer from frequent gout attacks, your caregiver may advise preventive medicine. These medicines are started after the acute attack subsides. These medicines either help your kidneys eliminate uric acid from your body or decrease your uric acid production. You may need to stay on these medicines for a very long time.  The  early phase of treatment with preventive medicine can be associated with an increase in acute gout attacks. For this reason, during the first few months of treatment, your caregiver may also advise you to take medicines usually used for acute gout treatment. Be sure you understand your caregiver's  directions. Your caregiver may make several adjustments to your medicine dose before these medicines are effective.  Discuss dietary treatment with your caregiver or dietitian. Alcohol and drinks high in sugar and fructose and foods such as meat, poultry, and seafood can increase uric acid levels. Your caregiver or dietitian can advise you on drinks and foods that should be limited. HOME CARE INSTRUCTIONS   Do not take aspirin to relieve pain. This raises uric acid levels.  Only take over-the-counter or prescription medicines for pain, discomfort, or fever as directed by your caregiver.  Rest the joint as much as possible. When in bed, keep sheets and blankets off painful areas.  Keep the affected joint raised (elevated).  Apply warm or cold treatments to painful joints. Use of warm or cold treatments depends on which works best for you.  Use crutches if the painful joint is in your leg.  Drink enough fluids to keep your urine clear or pale yellow. This helps your body get rid of uric acid. Limit alcohol, sugary drinks, and fructose drinks.  Follow your dietary instructions. Pay careful attention to the amount of protein you eat. Your daily diet should emphasize fruits, vegetables, whole grains, and fat-free or low-fat milk products. Discuss the use of coffee, vitamin C, and cherries with your caregiver or dietitian. These may be helpful in lowering uric acid levels.  Maintain a healthy body weight. SEEK MEDICAL CARE IF:   You develop diarrhea, vomiting, or any side effects from medicines.  You do not feel better in 24 hours, or you are getting worse. SEEK IMMEDIATE MEDICAL CARE IF:   Your joint becomes suddenly more tender, and you have chills or a fever. MAKE SURE YOU:   Understand these instructions.  Will watch your condition.  Will get help right away if you are not doing well or get worse. Document Released: 08/14/2000 Document Revised: 01/01/2014 Document Reviewed:  03/30/2012 [facility] Patient Information 2015 [place], [place]. This information is not intended to replace advice given to you by your health care provider. Make sure you discuss any questions you have with your health care provider.

## 2015-03-08 ENCOUNTER — Other Ambulatory Visit (INDEPENDENT_AMBULATORY_CARE_PROVIDER_SITE_OTHER): Payer: Commercial Managed Care - PPO

## 2015-03-08 DIAGNOSIS — M79676 Pain in unspecified toe(s): Secondary | ICD-10-CM

## 2015-03-08 LAB — URIC ACID: URIC ACID, SERUM: 4.6 mg/dL (ref 2.4–7.0)

## 2015-03-12 ENCOUNTER — Telehealth: Payer: Self-pay | Admitting: Primary Care

## 2015-03-12 NOTE — Telephone Encounter (Signed)
Called and notified patient of [name]'s comments. Patient verbalized understanding.  

## 2015-03-12 NOTE — Telephone Encounter (Signed)
Patient returned Chan's call and asked for her to leave a message if she doesn't answer.

## 2015-05-09 ENCOUNTER — Telehealth: Payer: Self-pay | Admitting: Family Medicine

## 2015-05-09 ENCOUNTER — Telehealth: Payer: Self-pay | Admitting: Primary Care

## 2015-05-09 NOTE — Telephone Encounter (Signed)
Ok with me, thanks.

## 2015-05-09 NOTE — Telephone Encounter (Signed)
Ok with me.  She has always been nice- have not seen in her in a couple of years.

## 2015-05-09 NOTE — Telephone Encounter (Signed)
Patient would like to switch from Dr.Aron to [employer].  Patient said she saw Jae Dire the last time she was in and really liked her.  Can patient switch?

## 2015-05-28 ENCOUNTER — Ambulatory Visit (INDEPENDENT_AMBULATORY_CARE_PROVIDER_SITE_OTHER): Payer: Commercial Managed Care - PPO | Admitting: Primary Care

## 2015-05-28 ENCOUNTER — Other Ambulatory Visit: Payer: Self-pay | Admitting: Primary Care

## 2015-05-28 ENCOUNTER — Encounter: Payer: Self-pay | Admitting: Primary Care

## 2015-05-28 VITALS — BP 120/68 | HR 74 | Temp 97.7°F | Ht 60.0 in | Wt 105.8 lb

## 2015-05-28 DIAGNOSIS — Z0000 Encounter for general adult medical examination without abnormal findings: Secondary | ICD-10-CM

## 2015-05-28 DIAGNOSIS — E559 Vitamin D deficiency, unspecified: Secondary | ICD-10-CM

## 2015-05-28 DIAGNOSIS — K219 Gastro-esophageal reflux disease without esophagitis: Secondary | ICD-10-CM

## 2015-05-28 LAB — LIPID PANEL
Cholesterol: 178 mg/dL (ref 0–200)
HDL: 55.8 mg/dL (ref >39.00)
LDL Cholesterol: 110 mg/dL — ABNORMAL HIGH (ref 0–99)
NONHDL: 122.36
Total CHOL/HDL Ratio: 3
Triglycerides: 63 mg/dL (ref 0.0–149.0)
VLDL: 12.6 mg/dL (ref 0.0–40.0)

## 2015-05-28 LAB — COMPREHENSIVE METABOLIC PANEL
ALK PHOS: 50 U/L (ref 39–117)
ALT: 11 U/L (ref 0–35)
AST: 13 U/L (ref 0–37)
Albumin: 4.4 g/dL (ref 3.5–5.2)
BILIRUBIN TOTAL: 1 mg/dL (ref 0.2–1.2)
BUN: 10 mg/dL (ref 6–23)
CO2: 31 meq/L (ref 19–32)
Calcium: 9.3 mg/dL (ref 8.4–10.5)
Chloride: 102 mEq/L (ref 96–112)
Creatinine, Ser: 0.66 mg/dL (ref 0.40–1.20)
GFR: 112.63 mL/min (ref >60.00)
GLUCOSE: 89 mg/dL (ref 70–99)
POTASSIUM: 3.9 meq/L (ref 3.5–5.1)
SODIUM: 140 meq/L (ref 135–145)
TOTAL PROTEIN: 7 g/dL (ref 6.0–8.3)

## 2015-05-28 LAB — VITAMIN D 25 HYDROXY (VIT D DEFICIENCY, FRACTURES): VITD: 27.94 ng/mL — AB (ref 30.00–100.00)

## 2015-05-28 MED ORDER — VITAMIN D (ERGOCALCIFEROL) 1.25 MG (50000 UNIT) PO CAPS: ORAL_CAPSULE | ORAL | Status: DC

## 2015-05-28 MED ORDER — RANITIDINE HCL 150 MG PO CAPS: 150.0000 mg | ORAL_CAPSULE | Freq: Two times a day (BID) | ORAL | Status: DC | PRN

## 2015-05-28 NOTE — Progress Notes (Signed)
Subjective:    Patient ID: [name], female    DOB: [birth year], 29 y.o.   MRN: [id number]  HPI  [name] is a 29 year old female who presents today for complete physical.  Immunizations: -Tetanus: Completed in 2007 -Influenza: Declines today.   Diet: Endorses a poor diet. Breakfast: Works at [employer]. Eats breakfast at work but tries to make healthier choices. Lunch: Skips Dinner: [employer], mashed potatoes, mac and cheese, pasta. Desserts: Occasionally Snacks: Cheese crackers, chips Beverages: Coke, water, sweet tea Exercise: She is not currently exercising. Eye exam: Completed last year Dental exam: Completes every 6 months. Pap Smear: Completed in 2014, negative.  1) Ear Fullness: Present for the past several months and located to bilateral ears. She reports intermittent pain. She's been taking Flonase, advil. She has noticed nasal congestion, postnasal drip, and intermittent cough as well. She has a history of seasonal allergies. Denies fevers, chest pain, SOB, fatigue.  Review of Systems  Constitutional: Negative for unexpected weight change.  HENT: Positive for congestion and ear pain.   Respiratory: Negative for shortness of breath.   Cardiovascular: Negative for chest pain.  Gastrointestinal: Negative for diarrhea and constipation.  Genitourinary: Positive for decreased urine volume. Negative for difficulty urinating.       Regular periods  Musculoskeletal:       Chronic hand pain  Skin: Negative for rash.  Allergic/Immunologic: Positive for environmental allergies.  Neurological: Negative for dizziness, numbness and headaches.  Psychiatric/Behavioral:       Denies concerns for anxiety or depression       Past Medical History  Diagnosis Date  . Low back pain   . Irritable bowel syndrome     Social History   Social History  . Marital Status: Married    Spouse Name: N/A  . Number of Children: 2  . Years of Education: N/A    Occupational History  . Manager Unemployed  .  [place]   Social History Main Topics  . Smoking status: Former Games developer  . Smokeless tobacco: Never Used  . Alcohol Use: Yes  . Drug Use: No  . Sexual Activity: Not on file   Other Topics Concern  . Not on file   Social History Narrative    Past Surgical History  Procedure Laterality Date  . Tubal ligation    . Cesarean section      x2    Family History  Problem Relation Age of Onset  . Diabetes Mother   . Diabetes Maternal Grandmother   . Diabetes Maternal Uncle   . Diabetes Maternal Uncle     No Known Allergies  Current Outpatient Prescriptions on File Prior to Visit  Medication Sig Dispense Refill  . indomethacin (INDOCIN) 25 MG capsule Take 1 capsule (25 mg total) by mouth 3 (three) times daily with meals. (Patient not taking: Reported on 05/28/2015) 30 capsule 0   No current facility-administered medications on file prior to visit.    BP 120/68 mmHg  Pulse 74  Temp(Src) 97.7 F (36.5 C) (Oral)  Ht 5' (1.524 m)  Wt 105 lb 12.8 oz (47.991 kg)  BMI 20.66 kg/m2  SpO2 98%  LMP 05/13/2015    Objective:   Physical Exam  Constitutional: She is oriented to person, place, and time. She appears well-nourished.  HENT:  Right Ear: Tympanic membrane is bulging. Tympanic membrane is not injected and not erythematous.  Left Ear: Tympanic membrane is bulging. Tympanic membrane is not injected and not erythematous.  Nose: Nose normal.  Mouth/Throat: Oropharynx is clear and moist.  Eyes: Conjunctivae are normal. Pupils are equal, round, and reactive to light.  Neck: Neck supple. No thyromegaly present.  Cardiovascular: Normal rate and regular rhythm.   Pulmonary/Chest: Effort normal and breath sounds normal.  Abdominal: Soft. Bowel sounds are normal. There is no tenderness.  Musculoskeletal: Normal range of motion.  Lymphadenopathy:    She has no cervical adenopathy.  Neurological: She is alert and oriented to  person, place, and time. She has normal reflexes. No cranial nerve deficit.  Skin: Skin is warm and dry.  Psychiatric: She has a normal mood and affect.          Assessment & Plan:

## 2015-05-28 NOTE — Progress Notes (Signed)
Pre visit review using our clinic review tool, if applicable. No additional management support is needed unless otherwise documented below in the visit note. 

## 2015-05-28 NOTE — Assessment & Plan Note (Signed)
Will check vitamin D levels today. Currently not managed on any medication.

## 2015-05-28 NOTE — Assessment & Plan Note (Signed)
Tdap and PAP UTD. Exam mostly unremarkable except for seasonal allergy involvement. Labs pending and will be done today. Discussed the importance of healthy diet and exercise. She is to work on reducing fast food and coke consumption. Follow up in 1 year for repeat physical.

## 2015-05-28 NOTE — Patient Instructions (Signed)
Complete lab work prior to leaving today. I will notify you of your results.  Start taking a daily antihistamine such as Claritin, Zyrtec, or Allegra. These may be purchased over the counter.  Continue to work on limiting fast and fried foods. Decrease your consumption of Coke and increase your consumption of water.  Refills have been sent in for your Zantac.  Follow up in 1 year for repeat physical or sooner if needed.  It was a pleasure to see you today!

## 2015-05-29 ENCOUNTER — Other Ambulatory Visit: Payer: Self-pay | Admitting: Primary Care

## 2015-05-29 DIAGNOSIS — Z113 Encounter for screening for infections with a predominantly sexual mode of transmission: Secondary | ICD-10-CM

## 2016-01-04 ENCOUNTER — Encounter: Payer: Self-pay | Admitting: Emergency Medicine

## 2016-01-04 ENCOUNTER — Emergency Department
Admission: EM | Admit: 2016-01-04 | Discharge: 2016-01-04 | Disposition: A | Discharge status: Home / Self Care | Payer: Commercial Managed Care - PPO | Attending: Emergency Medicine | Admitting: Emergency Medicine

## 2016-01-04 DIAGNOSIS — Y999 Unspecified external cause status: Secondary | ICD-10-CM | POA: Insufficient documentation

## 2016-01-04 DIAGNOSIS — S41152A Open bite of left upper arm, initial encounter: Secondary | ICD-10-CM | POA: Insufficient documentation

## 2016-01-04 DIAGNOSIS — Z87891 Personal history of nicotine dependence: Secondary | ICD-10-CM | POA: Diagnosis not present

## 2016-01-04 DIAGNOSIS — Y939 Activity, unspecified: Secondary | ICD-10-CM | POA: Insufficient documentation

## 2016-01-04 DIAGNOSIS — W540XXA Bitten by dog, initial encounter: Secondary | ICD-10-CM | POA: Insufficient documentation

## 2016-01-04 DIAGNOSIS — Z23 Encounter for immunization: Secondary | ICD-10-CM | POA: Diagnosis not present

## 2016-01-04 DIAGNOSIS — Y929 Unspecified place or not applicable: Secondary | ICD-10-CM | POA: Insufficient documentation

## 2016-01-04 MED ORDER — TETANUS-DIPHTH-ACELL PERTUSSIS 5-2.5-18.5 LF-MCG/0.5 IM SUSP
0.5000 mL | Freq: Once | INTRAMUSCULAR | Status: AC
  Administered 2016-01-04: 0.5 mL via INTRAMUSCULAR
  Filled 2016-01-04: qty 0.5

## 2016-01-04 NOTE — Discharge Instructions (Signed)

## 2016-01-04 NOTE — ED Notes (Signed)
Neighbors dog bit back of L arm approx 2 pm. Called police out for report. Sheriff [place] scene, [place]

## 2016-01-04 NOTE — ED Provider Notes (Signed)
[facility] Emergency Department Provider Note  ____________________________________________  Time seen: Approximately 8:02 PM  I have reviewed the triage vital signs and the nursing notes.   HISTORY  Chief Complaint Animal Bite    HPI [name] is a 30 y.o. female complaining of a dog bite on the left arm. Patient states her neighbors dog attacked her chihuaha and bit her as she was trying to get her dog away. She is not complaining of numbness or tingling in the arm, fevers, chills, nausea, vomiting or muscle aches/pains. She admits to localized pain and bruising surrounding the bite on the left arm. Tetanus status is not up to date but the patient confirmed with the dogs owner that it is up to date on rabies vaccinations.   Past Medical History  Diagnosis Date  . Low back pain   . Irritable bowel syndrome     Patient Active Problem List   Diagnosis Date Noted  . Routine general medical examination at a health care facility 08/26/2011  . Vitamin D deficiency 06/19/2010  . LOW BACK PAIN 06/19/2010    Past Surgical History  Procedure Laterality Date  . Tubal ligation    . Cesarean section      x2    Current Outpatient Rx  Name  Route  Sig  Dispense  Refill  . indomethacin (INDOCIN) 25 MG capsule   Oral   Take 25 mg by mouth 3 (three) times daily as needed.         . ranitidine (ZANTAC) 150 MG capsule   Oral   Take 1 capsule (150 mg total) by mouth 2 (two) times daily as needed.   60 capsule   11   . Vitamin D, Ergocalciferol, (DRISDOL) 50000 UNITS CAPS capsule      Take 1 capsule by mouth once weekly for a total of 12 weeks.   4 capsule   2     Allergies Review of patient's allergies indicates no known allergies.  Family History  Problem Relation Age of Onset  . Diabetes Mother   . Diabetes Maternal Grandmother   . Diabetes Maternal Uncle   . Diabetes Maternal Uncle     Social History Social History  Substance Use  Topics  . Smoking status: Former Games developermoker  . Smokeless tobacco: Never Used  . Alcohol Use: Yes     Review of Systems  Constitutional: No fever/chills Eyes: No visual changes. ENT: No upper respiratory complaints. Cardiovascular: no chest pain. Respiratory: no cough. No SOB. Gastrointestinal: No abdominal pain.  No nausea, no vomiting.  No diarrhea.  No constipation. Musculoskeletal: Negative for musculoskeletal pain. Skin: Negative for rash. Positive for a an abrasion to the left arm and bruising Neurological: Negative for headaches, focal weakness or numbness. 10-point ROS otherwise negative.  ____________________________________________   PHYSICAL EXAM:  VITAL SIGNS: ED Triage Vitals  Enc Vitals Group     BP 01/04/16 1834 115/86 mmHg     Pulse Rate 01/04/16 1834 85     Resp 01/04/16 1834 18     Temp 01/04/16 1834 98.4 F (36.9 C)     Temp Source 01/04/16 1834 Oral     SpO2 01/04/16 1834 97 %     Weight 01/04/16 1834 103 lb (46.72 kg)     Height 01/04/16 1834 5' (1.524 m)     Head Cir --      Peak Flow --      Pain Score 01/04/16 1836 8  Pain Loc --      Pain Edu? --      Excl. in GC? --      Constitutional: Alert and oriented. Well appearing and in no acute distress. Eyes: Conjunctivae are normal.  Neck: No stridor.   Cardiovascular: Good peripheral circulation. Respiratory: Normal respiratory effort without tachypnea or retractions.  Musculoskeletal: Full range of motion to all extremities. No gross deformities appreciated. Neurologic:  Normal speech and language. No gross focal neurologic deficits are appreciated.  Skin:  Skin is warm, dry and intact. No rash noted. Small abrasion noted on the left arm surrounded by mild ecchymosis Psychiatric: Mood and affect are normal. Speech and behavior are normal. Patient exhibits appropriate insight and judgement.   ____________________________________________   LABS (all labs ordered are listed, but only  abnormal results are displayed)  Labs Reviewed - No data to display  RADIOLOGY   No results found.  ____________________________________________    PROCEDURES  Procedure(s) performed:       Medications  Tdap (BOOSTRIX) injection 0.5 mL (0.5 mLs Intramuscular Given 01/04/16 2017)     ____________________________________________   INITIAL IMPRESSION / ASSESSMENT AND PLAN / ED COURSE  Pertinent labs & imaging results that were available during my care of the patient were reviewed by me and considered in my medical decision making (see chart for details).  Patient's diagnosis is consistent with dog bite to the left arm. There is no puncture wound and exam is reassuring. No imaging is ordered. Patient does not need antibiotics prophylactically. Patient will be discharged home after receiving tDap vaccination. Patient is to follow up with PCP as needed or otherwise directed. Patient is given ED precautions to return to the ED for any worsening or new symptoms.     ____________________________________________  FINAL CLINICAL IMPRESSION(S) / ED DIAGNOSES  Final diagnoses:  Dog bite of arm, left, initial encounter      NEW MEDICATIONS STARTED DURING THIS VISIT:  Discharge Medication List as of 01/04/2016  8:56 PM          This chart was dictated using voice recognition software/Dragon. Despite best efforts to proofread, errors can occur which can change the meaning. Any change was purely unintentional.   Racheal Patches, PA-C 01/04/16 2103  Emily Filbert, MD 01/04/16 2125

## 2016-01-04 NOTE — ED Notes (Signed)

## 2016-05-08 ENCOUNTER — Telehealth: Payer: Self-pay | Admitting: Primary Care

## 2016-05-08 NOTE — Telephone Encounter (Signed)
Pt called made an appointment for 9/14. She wanted to know if you could call her something in. For her nerves Her brother murder trial is coming up this week.  [facility]

## 2016-05-10 NOTE — Telephone Encounter (Signed)
Will evaluate patient on 09/14.

## 2016-05-11 NOTE — Telephone Encounter (Signed)
Spoken and notified patient of [name]'s comments. Patient verbalized understanding.  However, patient's work schedule has change. Reschedule appt on 05/13/2016 for eval

## 2016-05-11 NOTE — Telephone Encounter (Signed)
Pt left v/m requesting cb about message left on 05/08/16.

## 2016-05-13 ENCOUNTER — Ambulatory Visit (INDEPENDENT_AMBULATORY_CARE_PROVIDER_SITE_OTHER): Payer: Commercial Managed Care - PPO | Admitting: Primary Care

## 2016-05-13 ENCOUNTER — Encounter: Payer: Self-pay | Admitting: Primary Care

## 2016-05-13 VITALS — BP 142/86 | HR 83 | Temp 98.1°F | Ht 61.0 in | Wt 102.1 lb

## 2016-05-13 DIAGNOSIS — F329 Major depressive disorder, single episode, unspecified: Secondary | ICD-10-CM

## 2016-05-13 DIAGNOSIS — F32A Depression, unspecified: Secondary | ICD-10-CM

## 2016-05-13 DIAGNOSIS — F419 Anxiety disorder, unspecified: Secondary | ICD-10-CM

## 2016-05-13 MED ORDER — SERTRALINE HCL 50 MG PO TABS: 50.0000 mg | ORAL_TABLET | Freq: Every day | ORAL | 1 refills | Status: DC

## 2016-05-13 NOTE — Progress Notes (Signed)
Subjective:    Patient ID: [name], female    DOB: Aug 29, 1986, 30 y.o.   MRN: [id number]  HPI  [name] is a 30 year old female who presents today with a chief complaint of depression. Her brother was murdered over 1 year ago and the trial is rapidly approaching. Since her brother's murder she's experienced tearfulness, depression, difficulty doing things she's required to do, chest tightness. She cannot get through a conversation with her friends and family without crying. Her symptoms have progressed without improvement and she is fearful of her symptoms given the upcoming trial date. PHQ 9 score of 14 today, denies SI/HI. She has a family history of depression and has never been treated herself.  2) Ear Pain: Lcated to the left ear intermittently for the past 4 days. She describes her pain as achy. She's noticed decreased hearing. Denies fevers, cough, sore throat. She's been using Flonase and Sudafed with temporary improvement.   Review of Systems  Constitutional: Negative for chills and fever.  HENT: Positive for ear pain.   Respiratory: Negative for cough.   Psychiatric/Behavioral: Negative for sleep disturbance and suicidal ideas. The patient is nervous/anxious.        Depression       Past Medical History:  Diagnosis Date  . Irritable bowel syndrome   . Low back pain      Social History   Social History  . Marital status: Married    Spouse name: N/A  . Number of children: 2  . Years of education: N/A   Occupational History  . Manager Unemployed  .  [place]   Social History Main Topics  . Smoking status: Former Games developer  . Smokeless tobacco: Never Used  . Alcohol use Yes  . Drug use: No  . Sexual activity: Not on file   Other Topics Concern  . Not on file   Social History Narrative  . No narrative on file    Past Surgical History:  Procedure Laterality Date  . CESAREAN SECTION     x2  . TUBAL LIGATION      Family History  Problem  Relation Age of Onset  . Diabetes Mother   . Diabetes Maternal Grandmother   . Diabetes Maternal Uncle   . Diabetes Maternal Uncle     No Known Allergies  Current Outpatient Prescriptions on File Prior to Visit  Medication Sig Dispense Refill  . ranitidine (ZANTAC) 150 MG capsule Take 1 capsule (150 mg total) by mouth 2 (two) times daily as needed. 60 capsule 11   No current facility-administered medications on file prior to visit.     BP (!) 142/86   Pulse 83   Temp 98.1 F (36.7 C) (Oral)   Ht 5\' 1"  (1.549 m)   Wt 102 lb 1.9 oz (46.3 kg)   LMP 05/13/2016   SpO2 94%   BMI 19.30 kg/m    Objective:   Physical Exam  Constitutional: She appears well-nourished.  HENT:  Right Ear: Ear canal normal. Tympanic membrane is bulging. Tympanic membrane is not erythematous.  Left Ear: Ear canal normal. Tympanic membrane is not erythematous.  Nose: Right sinus exhibits no maxillary sinus tenderness and no frontal sinus tenderness. Left sinus exhibits no maxillary sinus tenderness and no frontal sinus tenderness.  Mouth/Throat: Oropharynx is clear and moist.  Mild effusion to left TM, no erythema.  Eyes: Conjunctivae are normal.  Neck: Neck supple.  Cardiovascular: Normal rate and regular rhythm.   Pulmonary/Chest: Effort  normal and breath sounds normal. She has no wheezes. She has no rales.  Lymphadenopathy:    She has no cervical adenopathy.  Skin: Skin is warm and dry.  Psychiatric:  Tearful but appropriate during exam          Assessment & Plan:  Ear Pain:  Located to left ear x 4 days. Some improvement with OTC treatment. Exam today without evidence of acute infection. Mild ear effusion to left. Continue Flonase, start Zyrtec. Follow up PRN.

## 2016-05-13 NOTE — Progress Notes (Signed)
Pre visit review using our clinic review tool, if applicable. No additional management support is needed unless otherwise documented below in the visit note. 

## 2016-05-13 NOTE — Assessment & Plan Note (Signed)
No prior diagnosis, has been grieving the loss of her brother since February 2016. PHQ 9 score of 14 today. Declines therapy. Will initiate treatment with medication due to presentation, assessment, and series of upcoming trials.  Rx for Zoloft 50 mg tablets sent to pharmacy. Patient is to take 1/2 tablet daily for 8 days, then advance to 1 full tablet thereafter. We discussed possible side effects of headache, GI upset, drowsiness, and SI/HI. If thoughts of SI/HI develop, we discussed to present to the emergency immediately. Patient verbalized understanding.   Follow up in 6 weeks for re-evaluation.

## 2016-05-13 NOTE — Patient Instructions (Signed)
Start Zoloft 50 mg tablets for depression. Take 1/2 tablet by mouth daily for 8 days, then advance to 1 full tablet thereafter.  Continue Flonase nasal spray for ear discomfort. Try Zyrtec at bedtime to help dry the fluid in your ear.  Follow up in 6 weeks for re-evaluation.   It was a pleasure to see you today!

## 2016-05-14 ENCOUNTER — Ambulatory Visit: Payer: Commercial Managed Care - PPO | Admitting: Primary Care

## 2016-06-10 ENCOUNTER — Telehealth: Payer: Self-pay

## 2016-06-10 NOTE — Telephone Encounter (Signed)
Pt left v/m pt wants to have scar removed from left elbow due to dog bite in May 2017. Pt not sure who she would go to for scar removal. Pt request cb. Last seen 05/13/16.

## 2016-06-11 NOTE — Telephone Encounter (Signed)
Message left for patient to return my call.  

## 2016-06-11 NOTE — Telephone Encounter (Signed)
She would likely need to see a Engineer, petroleumplastic surgeon for this. I recommend she call her insurance company to see who is in network. She can contact the plastic surgeon's office directly for an appointment.

## 2016-06-15 NOTE — Telephone Encounter (Signed)
Spoken and notified patient of [name]'s comments. Patient verbalized understanding. 

## 2016-06-24 ENCOUNTER — Ambulatory Visit: Payer: Commercial Managed Care - PPO | Admitting: Primary Care

## 2016-07-29 ENCOUNTER — Telehealth: Payer: Self-pay | Admitting: Primary Care

## 2016-07-29 NOTE — Telephone Encounter (Signed)
[facility] called -  Pt had a go to er disposition for severe abdominal pain. . Pt refused this, stating financial concerns. [facility] will be sending over report  Thanks

## 2016-07-29 NOTE — Telephone Encounter (Signed)
Patient Name: [name] DOB: [birth year] Initial Comment Caller has had abd pain for 3 days. Also having frequent bms. Nurse Assessment Nurse: Charna Elizabethrumbull, RN, Cathy Date/Time (Eastern Time): 07/29/2016 8:31:14 AM Confirm and document reason for call. If symptomatic, describe symptoms. You must click the next button to save text entered. ---Caller states she developed lower, centralized abdominal pain 3 days ago (pain rated as a 7 on the 1 to 10 scale). No diarrhea or vomiting. She developed dark stools after taking Pepto Bismal. No fever. Alert and responsive. Does the patient have any new or worsening symptoms? ---Yes Will a triage be completed? ---Yes Related visit to physician within the last 2 weeks? ---No Does the PT have any chronic conditions? (i.e. diabetes, asthma, etc.) ---Yes List chronic conditions. ---Anxiety, Depression, Irritable Bowel, Constipation Is the patient pregnant or possibly pregnant? (Ask all females between the ages of 2012-55) ---No Is this a behavioral health or substance abuse call? ---No Guidelines Guideline Title Affirmed Question Affirmed Notes Abdominal Pain - Female [1] SEVERE pain (e.g., excruciating) AND [2] present > 1 hour Final Disposition User Go to ED Now Charna Elizabethrumbull, RN, [name] Comments [name] declined the Go to ER disposition. She states she can't afford to go to the Er and wants to schedule an appointment with MD. Notified Melissa via the office backline and she shares that someone from the office will call [name] back. [name] notified. Referrals GO TO FACILITY OTHER - SPECIFY Disagree/Comply: Disagree Disagree/Comply Reason: Disagree with instructions

## 2016-07-29 NOTE — Telephone Encounter (Signed)
Noted and agree that she needs evaluation today. Please check on patient on 11/30 to ensure she was seen.

## 2016-07-29 NOTE — Telephone Encounter (Signed)
Pt said lower abd pain for 3 days but has worsened this morning; pain level 7. Between 7 AM - 7;30 AM this morning had 4 loose black BMs. Pt did take pepto bismol on 07/28/16. Pt concerned about the pain level; no fever or vomiting. Pt cannot afford to go to UC or ED. No available appts at our office today; transferred pt to [facility]na at [facility] to see if pt could be seen there today; Lupita LeashDonna has available appts and will speak with pt about specific appt time.. If pt condition changes or worsens prior to being seen at [facility] pt will go to ED. FYI to Mayra ReelKate Clark NP.

## 2016-07-30 NOTE — Telephone Encounter (Signed)
Spoken to patient and patient ended up going to urgent care. She is is feeling better today.

## 2016-07-30 NOTE — Telephone Encounter (Signed)
Noted  

## 2016-11-04 ENCOUNTER — Telehealth: Payer: Self-pay

## 2016-11-04 ENCOUNTER — Telehealth: Payer: Self-pay | Admitting: Primary Care

## 2016-11-04 NOTE — Telephone Encounter (Signed)
Pt left a voice mail requesting a refill on cyclobezeprine for neck muscle spasm.  [facility] [place] [id number] Last OV 05/13/16.

## 2016-11-04 NOTE — Telephone Encounter (Signed)
I spoke with pt and gave advise from 11/04/16 other phone note; and she said she cannot come to office due to her schedule. I advised pt to go to UC and she said she will not pay that money to go to UC and she will just suffer. Offered to schedule appt after she picked her kids up and she said no that would not work; so I advised her if she is hurting she would need to go to UC and pt hungup.FYI to Mayra ReelKate Clark NP.

## 2016-11-04 NOTE — Telephone Encounter (Signed)
I'm sorry to hear about her neck spasms. The cyclobenzaprine has not been prescribed in years, will need office visit.

## 2016-11-04 NOTE — Telephone Encounter (Signed)
A team health note came also.I spoke with pt and gave advise from 11/04/16 other phone note; and she said she cannot come to office due to her schedule. I advised pt to go to UC and she said she will not pay that money to go to UC and she will just suffer. Offered to schedule appt after she picked her kids up and she said no that would not work; so I advised her if she is hurting she would need to go to UC and pt hungup.FYI to Mayra ReelKate Clark NP.

## 2016-11-04 NOTE — Telephone Encounter (Signed)
Patient Name: [name]  DOB: April 01, 1986    Initial Comment Caller states she needs to get a refill on a medication. She has a muscle spasm in her neck.    Nurse Assessment  Nurse: Nicanor Bakeosta, RN, Marylene LandAngela Date/Time Lamount Cohen(Eastern Time): 11/04/2016 2:07:03 PM  Confirm and document reason for call. If symptomatic, describe symptoms. ---Yesterday, pt started getting posterior neck pain from the base of the skull to the middle of the shoulders. She states this has happened in the past and was told it was muscle spasms and given a muscle relaxer. She slept with a heating pad last night and ended up taking 3 Ibuprofen which did not help. She states the last time, she only took a few of the muscle relaxers and the rest she had left over have expired.  Does the patient have any new or worsening symptoms? ---Yes  Will a triage be completed? ---Yes  Related visit to physician within the last 2 weeks? ---No  Does the PT have any chronic conditions? (i.e. diabetes, asthma, etc.) ---Yes  List chronic conditions. ---low vitamin D  Is the patient pregnant or possibly pregnant? (Ask all females between the ages of 1612-55) ---No  Is this a behavioral health or substance abuse call? ---No     Guidelines    Guideline Title Affirmed Question Affirmed Notes  Neck Pain or Stiffness [1] SEVERE neck pain (e.g., excruciating, unable to do any normal activities) AND [2] not improved after 2 hours of pain medicine    Final Disposition User   See Physician within 4 Hours (or PCP triage) [place], RN, Marylene LandAngela    Comments  not able to make appt- has to pick up kids and not back home til after 4- goes to work at [employer]am and does not get off until 2pm. Not able to make an appt at this time   Referrals  GO TO FACILITY REFUSED   Disagree/Comply: Disagree  Disagree/Comply Reason: Disagree with instructions

## 2016-11-05 NOTE — Telephone Encounter (Signed)
Noted  

## 2016-11-05 NOTE — Telephone Encounter (Signed)
Noted. Recommend to continue Ibuprofen 600 mg TID, heating pad, stretching exercises. I'm happy to see her in the office at her convenience.

## 2016-11-05 NOTE — Telephone Encounter (Signed)
Spoken and notified patient of [name]'s comments. Patient stated that [name] has seen her for this before. She does want and have the money to come in the office. She stated that it is the same as before.

## 2016-11-19 ENCOUNTER — Emergency Department
Admission: EM | Admit: 2016-11-19 | Discharge: 2016-11-19 | Disposition: A | Discharge status: Home / Self Care | Payer: Commercial Managed Care - PPO | Attending: Emergency Medicine | Admitting: Emergency Medicine

## 2016-11-19 ENCOUNTER — Encounter: Payer: Self-pay | Admitting: Intensive Care

## 2016-11-19 DIAGNOSIS — R519 Headache, unspecified: Secondary | ICD-10-CM

## 2016-11-19 DIAGNOSIS — R51 Headache: Secondary | ICD-10-CM | POA: Insufficient documentation

## 2016-11-19 DIAGNOSIS — F17210 Nicotine dependence, cigarettes, uncomplicated: Secondary | ICD-10-CM | POA: Insufficient documentation

## 2016-11-19 LAB — CBC
HEMATOCRIT: 42.8 % (ref 35.0–47.0)
HEMOGLOBIN: 15 g/dL (ref 12.0–16.0)
MCH: 31.1 pg (ref 26.0–34.0)
MCHC: 35.1 g/dL (ref 32.0–36.0)
MCV: 88.5 fL (ref 80.0–100.0)
Platelets: 239 10*3/uL (ref 150–440)
RBC: 4.84 MIL/uL (ref 3.80–5.20)
RDW: 14 % (ref 11.5–14.5)
WBC: 8.4 10*3/uL (ref 3.6–11.0)

## 2016-11-19 LAB — COMPREHENSIVE METABOLIC PANEL
ALT: 20 U/L (ref 14–54)
ANION GAP: 11 (ref 5–15)
AST: 20 U/L (ref 15–41)
Albumin: 4.9 g/dL (ref 3.5–5.0)
Alkaline Phosphatase: 45 U/L (ref 38–126)
BUN: 11 mg/dL (ref 6–20)
CHLORIDE: 102 mmol/L (ref 101–111)
CO2: 23 mmol/L (ref 22–32)
Calcium: 9.4 mg/dL (ref 8.9–10.3)
Creatinine, Ser: 0.59 mg/dL (ref 0.44–1.00)
GFR calc Af Amer: >60 mL/min (ref >60)
GFR calc non Af Amer: >60 mL/min (ref >60)
Glucose, Bld: 118 mg/dL — ABNORMAL HIGH (ref 65–99)
POTASSIUM: 3.8 mmol/L (ref 3.5–5.1)
SODIUM: 136 mmol/L (ref 135–145)
Total Bilirubin: 1.2 mg/dL (ref 0.3–1.2)
Total Protein: 7.9 g/dL (ref 6.5–8.1)

## 2016-11-19 LAB — LIPASE, BLOOD: Lipase: 11 U/L (ref 11–51)

## 2016-11-19 MED ORDER — ONDANSETRON HCL 4 MG/2ML IJ SOLN
4.0000 mg | Freq: Once | INTRAMUSCULAR | Status: AC | PRN
  Administered 2016-11-19: 4 mg via INTRAVENOUS

## 2016-11-19 MED ORDER — ONDANSETRON HCL 4 MG/2ML IJ SOLN
INTRAMUSCULAR | Status: AC
  Administered 2016-11-19: 4 mg via INTRAVENOUS
  Filled 2016-11-19: qty 2

## 2016-11-19 MED ORDER — PROCHLORPERAZINE MALEATE 10 MG PO TABS: 10.0000 mg | ORAL_TABLET | Freq: Three times a day (TID) | ORAL | 0 refills | Status: DC | PRN

## 2016-11-19 MED ORDER — PROCHLORPERAZINE EDISYLATE 5 MG/ML IJ SOLN
10.0000 mg | INTRAMUSCULAR | Status: AC
  Administered 2016-11-19: 10 mg via INTRAVENOUS
  Filled 2016-11-19 (×2): qty 2

## 2016-11-19 MED ORDER — SODIUM CHLORIDE 0.9 % IV BOLUS (SEPSIS)
1000.0000 mL | Freq: Once | INTRAVENOUS | Status: AC
Start: 2016-11-19 — End: 2016-11-19
  Administered 2016-11-19: 1000 mL via INTRAVENOUS

## 2016-11-19 NOTE — ED Triage Notes (Signed)
Patient presents with migraine. It started while she was at work and left early to go home. C/o N/V since migraine started. Light and noise sensitivity.

## 2016-11-19 NOTE — Discharge Instructions (Signed)
Please seek medical attention for any high fevers, chest pain, shortness of breath, change in behavior, persistent vomiting, bloody stool or any other new or concerning symptoms.  

## 2016-11-19 NOTE — ED Provider Notes (Signed)
[facility] Emergency Department Provider Note  ____________________________________________   I have reviewed the triage vital signs and the nursing notes.   HISTORY  Chief Complaint Migraine   History limited by: Not Limited   HPI [name] is a 31 y.o. female who presents to the emergency department today because of concerns for headache. It started today. It started while the patient was ordered. It is severe. Initially patient indicated it was in the front of her head however then stated it was more posterior. Patient has had nausea and vomiting with it. She is sensitive to lights. Patient denies history of similar headaches in the past. Patient denies any trauma. Denies history of blood clots. Is not on any estrogen containing medications. Patient is a smoker.   Past Medical History:  Diagnosis Date  . Irritable bowel syndrome   . Low back pain     Patient Active Problem List   Diagnosis Date Noted  . Depression 05/13/2016  . Routine general medical examination at a health care facility 08/26/2011  . Vitamin D deficiency 06/19/2010  . LOW BACK PAIN 06/19/2010    Past Surgical History:  Procedure Laterality Date  . CESAREAN SECTION     x2  . TUBAL LIGATION      Prior to Admission medications   Medication Sig Start Date End Date Taking? Authorizing Provider  ranitidine (ZANTAC) 150 MG capsule Take 1 capsule (150 mg total) by mouth 2 (two) times daily as needed. 05/28/15   Doreene Nest, NP  sertraline (ZOLOFT) 50 MG tablet Take 1 tablet (50 mg total) by mouth daily. 05/13/16   Doreene Nest, NP    Allergies Penicillins  Family History  Problem Relation Age of Onset  . Diabetes Mother   . Diabetes Maternal Grandmother   . Diabetes Maternal Uncle   . Diabetes Maternal Uncle     Social History Social History  Substance Use Topics  . Smoking status: Current Every Day Smoker    Types: Cigarettes  . Smokeless tobacco:  Never Used  . Alcohol use Yes    Review of Systems  Constitutional: Negative for fever. Cardiovascular: Negative for chest pain. Respiratory: Negative for shortness of breath. Gastrointestinal: Negative for abdominal pain. Positive for nausea and vomiting.  Neurological: Positive for headache.  10-point ROS otherwise negative.  ____________________________________________   PHYSICAL EXAM:  VITAL SIGNS: ED Triage Vitals  Enc Vitals Group     BP 11/19/16 1430 106/60     Pulse Rate 11/19/16 1430 72     Resp 11/19/16 1430 20     Temp --      Temp src --      SpO2 11/19/16 1430 99 %     Weight 11/19/16 1442 105 lb (47.6 kg)     Height 11/19/16 1442 5' (1.524 m)     Head Circumference --      Peak Flow --      Pain Score 11/19/16 1442 10   Constitutional: Alert and oriented. Well appearing and in no distress. Eyes: Conjunctivae are normal. Normal extraocular movements. ENT   Head: Normocephalic and atraumatic.   Nose: No congestion/rhinnorhea.   Mouth/Throat: Mucous membranes are moist.   Neck: No stridor. Hematological/Lymphatic/Immunilogical: No cervical lymphadenopathy. Cardiovascular: Normal rate, regular rhythm.  No murmurs, rubs, or gallops.  Respiratory: Normal respiratory effort without tachypnea nor retractions. Breath sounds are clear and equal bilaterally. No wheezes/rales/rhonchi. Gastrointestinal: Soft and non tender. No rebound. No guarding.  Genitourinary: Deferred Musculoskeletal:  Normal range of motion in all extremities. No lower extremity edema. Neurologic:  Normal speech and language. No gross focal neurologic deficits are appreciated.  Skin:  Skin is warm, dry and intact. No rash noted. Psychiatric: Mood and affect are normal. Speech and behavior are normal. Patient exhibits appropriate insight and judgment.  ____________________________________________    LABS (pertinent positives/negatives)  Labs Reviewed  COMPREHENSIVE METABOLIC  PANEL - Abnormal; Notable for the following:       Result Value   Glucose, Bld 118 (*)    All other components within normal limits  LIPASE, BLOOD  CBC  URINALYSIS, COMPLETE (UACMP) WITH MICROSCOPIC     ____________________________________________   EKG  None  ____________________________________________    RADIOLOGY  None  ____________________________________________   PROCEDURES  Procedures  ____________________________________________   INITIAL IMPRESSION / ASSESSMENT AND PLAN / ED COURSE  Pertinent labs & imaging results that were available during my care of the patient were reviewed by me and considered in my medical decision making (see chart for details).  Patient presented to the emergency department today because of concerns for bad headache by mouth patient denied history of migraines. I did discuss with patient that we couldn't obtain neuro imaging to evaluate for any bleeds additionally I would be somewhat concern for blood clot given history of smoking. Patient however declined CT imaging. She did want to try medication and she did feel better after IV fluids and Compazine. She felt comfortable going home. I discussed return precautions.  ____________________________________________   FINAL CLINICAL IMPRESSION(S) / ED DIAGNOSES  Final diagnoses:  Bad headache     Note: This dictation was prepared with Dragon dictation. Any transcriptional errors that result from this process are unintentional     Phineas SemenGraydon Maedell Hedger, MD 11/19/16 1658

## 2017-03-26 ENCOUNTER — Telehealth: Payer: Self-pay

## 2017-03-26 ENCOUNTER — Telehealth: Payer: Self-pay | Admitting: Primary Care

## 2017-03-26 NOTE — Telephone Encounter (Signed)
See separate phone note on 03/26/17.

## 2017-03-26 NOTE — Telephone Encounter (Signed)
[facility] - Client TELEPHONE ADVICE RECORD [facility] Patient Name: [name] DOB: 1985/10/17 Initial Comment Caller states that she had an anxiety attack this morning. Nurse Assessment Nurse: Shelva MajesticWest, RN, [place] Date/Time Lamount Cohen(Eastern Time): 03/26/2017 4:09:10 PM Confirm and document reason for call. If symptomatic, describe symptoms. ---Caller states had an anxiety attack this morning, has been seen for this, prescribed medication for anxiety/depression, took two days didn't take it any more. No symptoms currently. Does the patient have any new or worsening symptoms? ---Yes Will a triage be completed? ---Yes Related visit to physician within the last 2 weeks? ---No Does the PT have any chronic conditions? (i.e. diabetes, asthma, etc.) ---NoIs the patient pregnant or possibly pregnant? (Ask all females between the ages of 6412-55) ---No Is this a behavioral health or substance abuse call? ---No Guidelines Guideline Title Affirmed Question Affirmed Notes Anxiety and Panic Attack Symptoms interfere with work or school Final Disposition User See Physician within 24 Hours [place], RN, [place] Comments cannot come in, has 1000$ bill for a headache, just wants medication changed to something else. Referrals GO TO FACILITY REFUSED Disagree/Comply: Disagree Disagree/Comply Reason: Disagree with instructions  Call Id: [phone]

## 2017-03-26 NOTE — Telephone Encounter (Signed)
Pt said she only took zoloft for 2 days and med made pt "feel weird". Pt stopped med. Pt last seen 05/13/16 and was to f/u in 6 weeks (pt did not come back). Pt said she cannot afford to go to ED. Pt is not SI or HI per pt. Pt does not want to schedule appt but [facility] and depression. I advised pt would need to schedule appt. Pt said no she was told if zoloft did not work to call back. Pt said she had bad anxiety attack this morning. I spoke with Mayra ReelKate Clark NP and Jae DireKate said pt needed 7430' appt and if condition changed or worsened or any SI/HI to go to ED. Pt voiced understanding but would not schedule appt. FYI to Mayra ReelKate Clark NP.

## 2017-03-26 NOTE — Telephone Encounter (Signed)
Noted  

## 2017-03-26 NOTE — Telephone Encounter (Signed)
Noted. I haven't seen patient in the office for follow up since Zoloft Rx was provided in September 2017, despite recommendations for follow up 6 weeks later. Needs appointment.

## 2017-04-02 ENCOUNTER — Other Ambulatory Visit (HOSPITAL_COMMUNITY)
Admission: RE | Admit: 2017-04-02 | Discharge: 2017-04-02 | Disposition: A | Discharge status: Home / Self Care | Payer: Commercial Managed Care - PPO | Source: Ambulatory Visit | Attending: Primary Care | Admitting: Primary Care

## 2017-04-02 ENCOUNTER — Ambulatory Visit (INDEPENDENT_AMBULATORY_CARE_PROVIDER_SITE_OTHER): Payer: Commercial Managed Care - PPO | Admitting: Primary Care

## 2017-04-02 ENCOUNTER — Encounter: Payer: Self-pay | Admitting: Primary Care

## 2017-04-02 VITALS — BP 130/74 | HR 74 | Ht 60.0 in | Wt 102.0 lb

## 2017-04-02 DIAGNOSIS — Z113 Encounter for screening for infections with a predominantly sexual mode of transmission: Secondary | ICD-10-CM | POA: Diagnosis not present

## 2017-04-02 DIAGNOSIS — E559 Vitamin D deficiency, unspecified: Secondary | ICD-10-CM | POA: Diagnosis not present

## 2017-04-02 DIAGNOSIS — F419 Anxiety disorder, unspecified: Secondary | ICD-10-CM | POA: Diagnosis not present

## 2017-04-02 DIAGNOSIS — K219 Gastro-esophageal reflux disease without esophagitis: Secondary | ICD-10-CM

## 2017-04-02 DIAGNOSIS — Z124 Encounter for screening for malignant neoplasm of cervix: Secondary | ICD-10-CM | POA: Diagnosis present

## 2017-04-02 DIAGNOSIS — F329 Major depressive disorder, single episode, unspecified: Secondary | ICD-10-CM

## 2017-04-02 DIAGNOSIS — Z0000 Encounter for general adult medical examination without abnormal findings: Secondary | ICD-10-CM

## 2017-04-02 LAB — LIPID PANEL
CHOLESTEROL: 182 mg/dL (ref 0–200)
HDL: 65.5 mg/dL (ref >39.00)
LDL CALC: 102 mg/dL — AB (ref 0–99)
NonHDL: 116.77
TRIGLYCERIDES: 75 mg/dL (ref 0.0–149.0)
Total CHOL/HDL Ratio: 3
VLDL: 15 mg/dL (ref 0.0–40.0)

## 2017-04-02 LAB — BASIC METABOLIC PANEL
BUN: 9 mg/dL (ref 6–23)
CALCIUM: 9.5 mg/dL (ref 8.4–10.5)
CHLORIDE: 100 meq/L (ref 96–112)
CO2: 31 mEq/L (ref 19–32)
CREATININE: 0.73 mg/dL (ref 0.40–1.20)
GFR: 99.01 mL/min (ref >60.00)
Glucose, Bld: 86 mg/dL (ref 70–99)
Potassium: 3.5 mEq/L (ref 3.5–5.1)
Sodium: 138 mEq/L (ref 135–145)

## 2017-04-02 LAB — VITAMIN D 25 HYDROXY (VIT D DEFICIENCY, FRACTURES): VITD: 30.51 ng/mL (ref 30.00–100.00)

## 2017-04-02 MED ORDER — ESCITALOPRAM OXALATE 5 MG PO TABS: 5.0000 mg | ORAL_TABLET | Freq: Every day | ORAL | 1 refills | Status: DC

## 2017-04-02 MED ORDER — RANITIDINE HCL 150 MG PO CAPS: ORAL_CAPSULE | ORAL | 3 refills | Status: DC

## 2017-04-02 NOTE — Assessment & Plan Note (Signed)
Immunizations UTD. Pap due, completed today and pending. Exam today unremarkable. Discussed anxiety and depression, see HPI and assessment and plan for treatment. Labs pending. Follow up in 1 year.

## 2017-04-02 NOTE — Addendum Note (Signed)
Addended by: Liane ComberHAVERS, NATASHA C on: 04/02/2017 02:00 PM   Modules accepted: Orders

## 2017-04-02 NOTE — Assessment & Plan Note (Signed)
Refills for Zantac sent to pharmacy. Symptoms controlled on Zantac.

## 2017-04-02 NOTE — Progress Notes (Signed)
Subjective:    Patient ID: [name], female    DOB: 1986/03/26, 31 y.o.   MRN: [id number]  HPI  Ms. [name] is a 31 year old female who presents today for complete physical.  1) Anxiety Attacks: Symptoms of chest tightness, feels anxious/panic. These attacks are occurring more frequently over the past 2 weeks. History of panic attacks for several years ago. She was initiated on Zoloft in 2016 but stopped after 3 doses as she "didn't feel right". GAD 7 score of 11 and PHQ 9 score of 14. Denies SI/HI.  Immunizations: -Tetanus: Completed in 2017 -Influenza: Did not receive last season   Diet: She endorses a poor diet. Breakfast: Fast food Lunch: Fast food Dinner: Skips Snacks: Chips Desserts: Occasionally  Beverages: Water, soda  Exercise: Does not currently exercise. Eye exam: Completed 1-2 years ago. Dental exam: Completes semi-annually Pap Smear: Completed in 2014, negative   Review of Systems  Constitutional: Negative for unexpected weight change.  HENT: Negative for rhinorrhea.   Eyes: Negative for itching.       Dry eyes  Respiratory: Negative for cough and shortness of breath.   Cardiovascular: Negative for chest pain.  Gastrointestinal: Negative for constipation and diarrhea.  Genitourinary: Negative for difficulty urinating and menstrual problem.  Musculoskeletal: Negative for arthralgias and myalgias.  Skin: Negative for rash.  Allergic/Immunologic: Positive for environmental allergies.  Neurological: Negative for dizziness, numbness and headaches.  Psychiatric/Behavioral:       Anxiety attacks       Past Medical History:  Diagnosis Date  . Irritable bowel syndrome   . Low back pain      Social History   Social History  . Marital status: Married    Spouse name: N/A  . Number of children: 2  . Years of education: N/A   Occupational History  . Manager Unemployed  .  [employer]   Social History Main Topics  . Smoking status: Current  Every Day Smoker    Packs/day: 1.00    Types: Cigarettes  . Smokeless tobacco: Never Used  . Alcohol use Yes     Comment: 2-3 BEERS A DAY   . Drug use: No  . Sexual activity: Not on file   Other Topics Concern  . Not on file   Social History Narrative  . No narrative on file    Past Surgical History:  Procedure Laterality Date  . CESAREAN SECTION     x2  . TUBAL LIGATION      Family History  Problem Relation Age of Onset  . Diabetes Mother   . Diabetes Maternal Grandmother   . Diabetes Maternal Uncle   . Diabetes Maternal Uncle     Allergies  Allergen Reactions  . Penicillins     Current Outpatient Prescriptions on File Prior to Visit  Medication Sig Dispense Refill  . prochlorperazine (COMPAZINE) 10 MG tablet Take 1 tablet (10 mg total) by mouth every 8 (eight) hours as needed (headache). 10 tablet 0   No current facility-administered medications on file prior to visit.     BP 130/74   Pulse 74   Ht 5' (1.524 m)   Wt 102 lb (46.3 kg)   LMP 03/18/2017   SpO2 98%   BMI 19.92 kg/m    Objective:   Physical Exam  Constitutional: She is oriented to person, place, and time. She appears well-nourished.  HENT:  Right Ear: Tympanic membrane and ear canal normal.  Left Ear: Tympanic membrane and ear  canal normal.  Nose: Nose normal.  Mouth/Throat: Oropharynx is clear and moist.  Eyes: Pupils are equal, round, and reactive to light. Conjunctivae and EOM are normal.  Neck: Neck supple. No thyromegaly present.  Cardiovascular: Normal rate and regular rhythm.   No murmur heard. Pulmonary/Chest: Effort normal and breath sounds normal. She has no rales.  Abdominal: Soft. Bowel sounds are normal. There is no tenderness.  Genitourinary: There is no tenderness on the right labia. There is no tenderness on the left labia. Cervix exhibits no motion tenderness and no discharge. Right adnexum displays no tenderness. Left adnexum displays no tenderness. No vaginal discharge  found.  Musculoskeletal: Normal range of motion.  Lymphadenopathy:    She has no cervical adenopathy.  Neurological: She is alert and oriented to person, place, and time. She has normal reflexes. No cranial nerve deficit.  Skin: Skin is warm and dry. No rash noted.  Psychiatric:  Overall stable, tearful during exam.          Assessment & Plan:

## 2017-04-02 NOTE — Assessment & Plan Note (Addendum)
GAD 7 score of 11 and PHQ 9 score of 14 today. Discussed options for treatment, she opts for medication. Discussed that she will need to give the medication more time, and notify if she experiences intolerable side effects.  Rx for Lexapro 5 mg sent to pharmacy. Take 1 tablet daily. We discussed possible side effects of headache, GI upset, drowsiness, and SI/HI. If thoughts of SI/HI develop, we discussed to present to the emergency immediately. Patient verbalized understanding.   Follow up in 6 weeks for re-evaluation

## 2017-04-02 NOTE — Patient Instructions (Addendum)
Start Lexapro 5 mg tablets for anxiety and depression. Take 1 tablet by mouth once daily.  Please message me if you have any problems with this medication.   Complete lab work prior to leaving today. I will notify you of your results once received.   We will notify you of your Pap results once received.  Schedule a follow up visit in 6 weeks for re-evaluation.  It was a pleasure to see you today!

## 2017-04-03 LAB — HIV ANTIBODY (ROUTINE TESTING W REFLEX): HIV 1&2 Ab, 4th Generation: NONREACTIVE

## 2017-04-03 LAB — RPR

## 2017-04-05 LAB — CYTOLOGY - PAP
CHLAMYDIA, DNA PROBE: NEGATIVE
DIAGNOSIS: NEGATIVE
HPV (WINDOPATH): NOT DETECTED
NEISSERIA GONORRHEA: NEGATIVE

## 2017-04-05 LAB — HSV(HERPES SIMPLEX VRS) I + II AB-IGG

## 2017-04-08 LAB — HSV 1/2 AB (IGM), IFA W/RFLX TITER
HSV 1 IGM SCREEN: NEGATIVE
HSV 2 IGM SCREEN: NEGATIVE

## 2017-05-05 ENCOUNTER — Telehealth: Payer: Self-pay

## 2017-05-05 NOTE — Telephone Encounter (Signed)
Pt left v/m; pt seen 04/02/17; pt stopped med ?Lexapro on Wednesday due to feeling too tired. And pt request med for muscle spasms in [facility]. Pt request cb.

## 2017-05-05 NOTE — Telephone Encounter (Signed)
Please notify patient that we can try something else for her anxiety or depression. Please also notify her that we don't give out muscle relaxers on request, she will need an appointment to discuss if she has an acute problem. If she'd like to discuss her anxiety and depression and muscle spasms we can do it all in one visit. She can try taking ibuprofen 600 mg 3 times daily in the interim.

## 2017-05-06 NOTE — Telephone Encounter (Signed)
I have tried to notified patient of the information below. Patient is upset and she stated that every time, she get told to call if problem and then gets a call that she needs to make an appointment. Patient stated that it is unfair for someone like her to make such a co-pay each time, patient stated that this is hard for her sometimes.  Patient also wanted to mention that  on her visit on 04/02/17, patient did not feel she had what a physical as it should be. Patient stated that she told the assistant on that day regarding the neck pain but it was never addressed. Patient felt rush on her office visit.

## 2017-05-06 NOTE — Telephone Encounter (Signed)
Patient was not rushed on her visit in early August, we completed a physical exam with Pap smear and treated her anxiety. This visit was over 30 minutes. She does have multiple other concerns but given time constraints it was recommended she schedule another appointment to discuss her other issues.  Again, I'm happy to address her neck but she will need to be evaluated first. Also offering to talk about her anxiety during that visit as well.

## 2017-05-07 NOTE — Telephone Encounter (Signed)
Message left for patient to return my call.  

## 2017-05-13 NOTE — Telephone Encounter (Signed)
Patient stated that she does not want to come in. She have already canceled her appt on 05/17/2017

## 2017-05-17 ENCOUNTER — Ambulatory Visit: Payer: Commercial Managed Care - PPO | Admitting: Primary Care

## 2019-07-18 ENCOUNTER — Ambulatory Visit (INDEPENDENT_AMBULATORY_CARE_PROVIDER_SITE_OTHER): Payer: 59

## 2019-07-18 DIAGNOSIS — Z23 Encounter for immunization: Secondary | ICD-10-CM

## 2019-07-23 ENCOUNTER — Other Ambulatory Visit: Payer: Self-pay | Admitting: Primary Care

## 2019-07-23 DIAGNOSIS — Z0000 Encounter for general adult medical examination without abnormal findings: Secondary | ICD-10-CM

## 2019-07-23 DIAGNOSIS — E559 Vitamin D deficiency, unspecified: Secondary | ICD-10-CM

## 2019-07-31 ENCOUNTER — Telehealth: Payer: Self-pay

## 2019-07-31 NOTE — Telephone Encounter (Signed)
LVM to call clinic, pt needs COVID screen, back lab and front door info 11.30.2020 TLJ

## 2019-08-02 ENCOUNTER — Other Ambulatory Visit: Payer: Self-pay

## 2019-08-02 ENCOUNTER — Other Ambulatory Visit (INDEPENDENT_AMBULATORY_CARE_PROVIDER_SITE_OTHER): Payer: 59

## 2019-08-02 DIAGNOSIS — Z0000 Encounter for general adult medical examination without abnormal findings: Secondary | ICD-10-CM

## 2019-08-02 LAB — COMPREHENSIVE METABOLIC PANEL
ALT: 11 U/L (ref 0–35)
AST: 12 U/L (ref 0–37)
Albumin: 4.2 g/dL (ref 3.5–5.2)
Alkaline Phosphatase: 36 U/L — ABNORMAL LOW (ref 39–117)
BUN: 5 mg/dL — ABNORMAL LOW (ref 6–23)
CO2: 30 mEq/L (ref 19–32)
Calcium: 9 mg/dL (ref 8.4–10.5)
Chloride: 103 mEq/L (ref 96–112)
Creatinine, Ser: 0.54 mg/dL (ref 0.40–1.20)
GFR: 129.97 mL/min (ref >60.00)
Glucose, Bld: 89 mg/dL (ref 70–99)
Potassium: 3.7 mEq/L (ref 3.5–5.1)
Sodium: 140 mEq/L (ref 135–145)
Total Bilirubin: 0.9 mg/dL (ref 0.2–1.2)
Total Protein: 6.3 g/dL (ref 6.0–8.3)

## 2019-08-02 LAB — LIPID PANEL
Cholesterol: 159 mg/dL (ref 0–200)
HDL: 42.6 mg/dL (ref >39.00)
LDL Cholesterol: 105 mg/dL — ABNORMAL HIGH (ref 0–99)
NonHDL: 116.25
Total CHOL/HDL Ratio: 4
Triglycerides: 54 mg/dL (ref 0.0–149.0)
VLDL: 10.8 mg/dL (ref 0.0–40.0)

## 2019-08-02 LAB — CBC
HCT: 38.8 % (ref 36.0–46.0)
Hemoglobin: 13 g/dL (ref 12.0–15.0)
MCHC: 33.5 g/dL (ref 30.0–36.0)
MCV: 88 fl (ref 78.0–100.0)
Platelets: 243 10*3/uL (ref 150.0–400.0)
RBC: 4.41 Mil/uL (ref 3.87–5.11)
RDW: 13.3 % (ref 11.5–15.5)
WBC: 5.6 10*3/uL (ref 4.0–10.5)

## 2019-08-09 ENCOUNTER — Encounter: Payer: Self-pay | Admitting: Primary Care

## 2019-08-09 ENCOUNTER — Other Ambulatory Visit: Payer: Self-pay

## 2019-08-09 ENCOUNTER — Ambulatory Visit (INDEPENDENT_AMBULATORY_CARE_PROVIDER_SITE_OTHER): Payer: 59 | Admitting: Primary Care

## 2019-08-09 VITALS — BP 124/80 | HR 94 | Temp 97.3°F | Ht 60.0 in | Wt 106.0 lb

## 2019-08-09 DIAGNOSIS — F329 Major depressive disorder, single episode, unspecified: Secondary | ICD-10-CM

## 2019-08-09 DIAGNOSIS — M62838 Other muscle spasm: Secondary | ICD-10-CM

## 2019-08-09 DIAGNOSIS — Z0000 Encounter for general adult medical examination without abnormal findings: Secondary | ICD-10-CM | POA: Diagnosis not present

## 2019-08-09 DIAGNOSIS — F419 Anxiety disorder, unspecified: Secondary | ICD-10-CM | POA: Diagnosis not present

## 2019-08-09 DIAGNOSIS — K219 Gastro-esophageal reflux disease without esophagitis: Secondary | ICD-10-CM | POA: Diagnosis not present

## 2019-08-09 DIAGNOSIS — F32A Depression, unspecified: Secondary | ICD-10-CM

## 2019-08-09 MED ORDER — ESCITALOPRAM OXALATE 5 MG PO TABS: 5.0000 mg | ORAL_TABLET | Freq: Every day | ORAL | 0 refills | Status: DC

## 2019-08-09 MED ORDER — TIZANIDINE HCL 4 MG PO TABS: 4.0000 mg | ORAL_TABLET | Freq: Three times a day (TID) | ORAL | 0 refills | Status: AC | PRN

## 2019-08-09 NOTE — Assessment & Plan Note (Signed)
Intermittent to thoracic back. Does well with infrequent use of muscle relaxer. Rx for Tizanidine provided. This also helps to prevent migraines. Use sparingly.

## 2019-08-09 NOTE — Assessment & Plan Note (Signed)
No longer on Lexapro,  is under a lot of stress and would like to resume her Lexapro. Didn't do well with Zoloft.   We discussed possible side effects of headache, GI upset, drowsiness, and SI/HI. If thoughts of SI/HI develop, we discussed to present to the emergency immediately. Patient verbalized understanding.   She will update in 4 weeks via my chart.  Denies SI/HI.

## 2019-08-09 NOTE — Assessment & Plan Note (Signed)
Intermittent 3-4 days weekly, overall improved.  Improved with Tums, takes several doses.   Discussed use of pepcid for more frequent symptoms.  She will update.

## 2019-08-09 NOTE — Patient Instructions (Addendum)
Resume escitalopram (Lexapro) 5 mg once daily for anxiety.  You can take the tizanidine tablets as needed for muscle spasms. This may cause drowsiness.   Start exercising. You should be getting 150 minutes of moderate intensity exercise weekly.  It's important to improve your diet by reducing consumption of fast food, fried food, processed snack foods, sugary drinks. Increase consumption of fresh vegetables and fruits, whole grains, water.  Ensure you are drinking 64 ounces of water daily.  It was a pleasure to see you today!   Preventive Care 71-68 Years Old, Female Preventive care refers to visits with your health care provider and lifestyle choices that can promote health and wellness. This includes:  A yearly physical exam. This may also be called an annual well check.  Regular dental visits and eye exams.  Immunizations.  Screening for certain conditions.  Healthy lifestyle choices, such as eating a healthy diet, getting regular exercise, not using drugs or products that contain nicotine and tobacco, and limiting alcohol use. What can I expect for my preventive care visit? Physical exam Your health care provider will check your:  Height and weight. This may be used to calculate body mass index (BMI), which tells if you are at a healthy weight.  Heart rate and blood pressure.  Skin for abnormal spots. Counseling Your health care provider may ask you questions about your:  Alcohol, tobacco, and drug use.  Emotional well-being.  Home and relationship well-being.  Sexual activity.  Eating habits.  Work and work Statistician.  Method of birth control.  Menstrual cycle.  Pregnancy history. What immunizations do I need?  Influenza (flu) vaccine  This is recommended every year. Tetanus, diphtheria, and pertussis (Tdap) vaccine  You may need a Td booster every 10 years. Varicella (chickenpox) vaccine  You may need this if you have not been vaccinated. Human  papillomavirus (HPV) vaccine  If recommended by your health care provider, you may need three doses over 6 months. Measles, mumps, and rubella (MMR) vaccine  You may need at least one dose of MMR. You may also need a second dose. Meningococcal conjugate (MenACWY) vaccine  One dose is recommended if you are age 10-21 years and a first-year college student living in a residence hall, or if you have one of several medical conditions. You may also need additional booster doses. Pneumococcal conjugate (PCV13) vaccine  You may need this if you have certain conditions and were not previously vaccinated. Pneumococcal polysaccharide (PPSV23) vaccine  You may need one or two doses if you smoke cigarettes or if you have certain conditions. Hepatitis A vaccine  You may need this if you have certain conditions or if you travel or work in places where you may be exposed to hepatitis A. Hepatitis B vaccine  You may need this if you have certain conditions or if you travel or work in places where you may be exposed to hepatitis B. Haemophilus influenzae type b (Hib) vaccine  You may need this if you have certain conditions. You may receive vaccines as individual doses or as more than one vaccine together in one shot (combination vaccines). Talk with your health care provider about the risks and benefits of combination vaccines. What tests do I need?  Blood tests  Lipid and cholesterol levels. These may be checked every 5 years starting at age 20.  Hepatitis C test.  Hepatitis B test. Screening  Diabetes screening. This is done by checking your blood sugar (glucose) after you have not eaten  for a while (fasting).  Sexually transmitted disease (STD) testing.  BRCA-related cancer screening. This may be done if you have a family history of breast, ovarian, tubal, or peritoneal cancers.  Pelvic exam and Pap test. This may be done every 3 years starting at age 76. Starting at age 48, this may be  done every 5 years if you have a Pap test in combination with an HPV test. Talk with your health care provider about your test results, treatment options, and if necessary, the need for more tests. Follow these instructions at home: Eating and drinking   Eat a diet that includes fresh fruits and vegetables, whole grains, lean protein, and low-fat dairy.  Take vitamin and mineral supplements as recommended by your health care provider.  Do not drink alcohol if: ? Your health care provider tells you not to drink. ? You are pregnant, may be pregnant, or are planning to become pregnant.  If you drink alcohol: ? Limit how much you have to 0-1 drink a day. ? Be aware of how much alcohol is in your drink. In the [place] one drink equals one 12 oz bottle of beer (355 mL), one 5 oz glass of wine (148 mL), or one 1 oz glass of hard liquor (44 mL). Lifestyle  Take daily care of your teeth and gums.  Stay active. Exercise for at least 30 minutes on 5 or more days each week.  Do not use any products that contain nicotine or tobacco, such as cigarettes, e-cigarettes, and chewing tobacco. If you need help quitting, ask your health care provider.  If you are sexually active, practice safe sex. Use a condom or other form of birth control (contraception) in order to prevent pregnancy and STIs (sexually transmitted infections). If you plan to become pregnant, see your health care provider for a preconception visit. What's next?  Visit your health care provider once a year for a well check visit.  Ask your health care provider how often you should have your eyes and teeth checked.  Stay up to date on all vaccines. This information is not intended to replace advice given to you by your health care provider. Make sure you discuss any questions you have with your health care provider. Document Released: 10/13/2001 Document Revised: 04/28/2018 Document Reviewed: 04/28/2018 Elsevier Patient Education  2020  [employer].

## 2019-08-09 NOTE — Progress Notes (Signed)
Subjective:    Patient ID: [name], female    DOB: 1986-07-30, 33 y.o.   MRN: [id number]  HPI  [name] is a 33 year old female who presents today for complete physical.  She would also like to discuss intermittent back spasms. Overall infrequent but did experience a recent episode a few weeks ago. The spasm started to the left lower thoracic back with radiation to left scapular region which lasted for one week, then caused a migraine. She's been on muscle relaxer medication in the past, one of which helped and one did not.  Also with increased anxiety and depression symptoms over the past one year. She is caring for her father in law who has several co-morbidities as well as taking care of her own family. She was once on Lexapro 5 mg a few years ago, can't remember if it was helpful. Zoloft was not helpful. She is open to retrying.   Immunizations: -Tetanus: Completed in 2017 -Influenza: Completed this season   Diet: She endorses a poor diet.  Exercise: She is exercising on occasion.   Eye exam: No recent exam Dental exam: Completes   Pap Smear: Completed in 2018  BP Readings from Last 3 Encounters:  08/09/19 124/80  04/02/17 130/74  11/19/16 105/62      Review of Systems  Constitutional: Negative for unexpected weight change.  HENT: Negative for rhinorrhea.   Respiratory: Negative for cough and shortness of breath.   Cardiovascular: Negative for chest pain.  Gastrointestinal: Negative for constipation and diarrhea.  Genitourinary: Negative for difficulty urinating and menstrual problem.  Musculoskeletal: Positive for myalgias.  Skin: Negative for rash.  Allergic/Immunologic: Negative for environmental allergies.  Neurological: Negative for dizziness, numbness and headaches.  Psychiatric/Behavioral:       See hPI       Past Medical History:  Diagnosis Date  . Irritable bowel syndrome   . Low back pain      Social History   Socioeconomic History   . Marital status: Married    Spouse name: Not on file  . Number of children: 2  . Years of education: Not on file  . Highest education level: Not on file  Occupational History  . 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 Financial resource strain: Not on file  . Food insecurity    Worry: Not on file    Inability: Not on file  . Transportation needs    Medical: Not on file    Non-medical: Not on file  Tobacco Use  . Smoking status: Current Every Day Smoker    Packs/day: 1.00    Types: Cigarettes  . Smokeless tobacco: Never Used  Substance and Sexual Activity  . Alcohol use: Yes    Comment: 2-3 BEERS A DAY   . Drug use: No  . Sexual activity: Not on file  Lifestyle  . Physical activity    Days per week: Not on file    Minutes per session: Not on file  . Stress: Not on file  Relationships  . Social Herbalist on phone: Not on file    Gets together: Not on file    Attends religious service: Not on file    Active member of club or organization: Not on file    Attends meetings of clubs or organizations: Not on file    Relationship status: Not on file  . Intimate partner violence  Fear of current or ex partner: Not on file    Emotionally abused: Not on file    Physically abused: Not on file    Forced sexual activity: Not on file  Other Topics Concern  . Not on file  Social History Narrative  . Not on file    Past Surgical History:  Procedure Laterality Date  . CESAREAN SECTION     x2  . TUBAL LIGATION      Family History  Problem Relation Age of Onset  . Diabetes Mother   . Diabetes Maternal Grandmother   . Diabetes Maternal Uncle   . Diabetes Maternal Uncle     Allergies  Allergen Reactions  . Penicillins     No current outpatient medications on file prior to visit.   No current facility-administered medications on file prior to visit.     BP 124/80   Pulse 94   Temp (!) 97.3 F (36.3 C)  (Temporal)   Ht 5' (1.524 m)   Wt 106 lb (48.1 kg)   LMP 07/20/2019   SpO2 98%   BMI 20.70 kg/m    Objective:   Physical Exam  Constitutional: She is oriented to person, place, and time. She appears well-nourished.  HENT:  Right Ear: Tympanic membrane and ear canal normal.  Left Ear: Tympanic membrane and ear canal normal.  Mouth/Throat: Oropharynx is clear and moist.  Eyes: Pupils are equal, round, and reactive to light. EOM are normal.  Neck: Neck supple.  Cardiovascular: Normal rate and regular rhythm.  Respiratory: Effort normal and breath sounds normal.  GI: Soft. Bowel sounds are normal. There is no abdominal tenderness.  Musculoskeletal: Normal range of motion.  Neurological: She is alert and oriented to person, place, and time. No cranial nerve deficit.  Reflex Scores:      Patellar reflexes are 2+ on the right side and 2+ on the left side. Skin: Skin is warm and dry.  Psychiatric: She has a normal mood and affect.           Assessment & Plan:

## 2019-08-09 NOTE — Assessment & Plan Note (Signed)
Immunizations UTD. Pap smear UTD, due in 2018. Encouraged a healthy diet and regular exercise. Exam today unremarkable. Labs reviewed.

## 2019-10-11 ENCOUNTER — Telehealth: Payer: Self-pay | Admitting: Primary Care

## 2019-10-11 NOTE — Telephone Encounter (Signed)
Pt called wanting someone to call her regarding her bill. She was here on 12/9 for cpx.    She wanted to be explained why she was charged 72 for telling kate about her muscle spasms in neck. Jae Dire asked pt if she had any questions  She state if she was told she would be charged for extra visit she would of keep her mouth shut  Pt was very upset

## 2019-10-11 NOTE — Telephone Encounter (Signed)
I spoke with the patient regarding her concerns. She is still upset with the outcome and would like to speak with an higher up.

## 2019-10-13 NOTE — Telephone Encounter (Signed)
Noted, I will review and contact patient when I return to the office next week.

## 2019-10-17 NOTE — Telephone Encounter (Signed)
Left message that I was returning patients call.

## 2019-10-30 NOTE — Telephone Encounter (Signed)
Patient returned call to discuss her billing concerns.  She does not feel that she should be responsible for the additional charges incurred at her CPE visit because her provider asked her if there was anything else to discuss.   I explained the difference between preventive care and acute sick care. And, being that patient discussed her muscle strain along with anxiety issues as well as medications were ordered and changes made, that this qualifies for sick care and not routine well care.  Options would have been for her to address at this visit or for provider to schedule for patient to come back.  By doing the 2 visits together this saved patient a trip, but charges would have applied regardless because of what is being addressed.   I explained that I understand and am sorry that this is so confusing, but we do have signage in the exam rooms and that we will remind providers to reference this during the exam.   As the care was provided, we cannot remove the charges as this would be fraud and there is nothing that I can do.  I did offer to give her the number of patient experience if she wanted to go higher than I but, unfortunately, there was nothing further that could be done.   Patient stated that she would find another provider as this is the "second time we have done this to her" and she hung up the phone on me.   FYI to PCP.

## 2019-10-31 NOTE — Telephone Encounter (Signed)
Noted and appreciate the follow up on this! 

## 2019-11-01 ENCOUNTER — Other Ambulatory Visit: Payer: Self-pay | Admitting: Primary Care

## 2019-11-01 DIAGNOSIS — F419 Anxiety disorder, unspecified: Secondary | ICD-10-CM

## 2019-11-01 DIAGNOSIS — F329 Major depressive disorder, single episode, unspecified: Secondary | ICD-10-CM
